# Patient Record
Sex: Male | Born: 1975 | Hispanic: No | Marital: Single | Smoking: Never smoker
Health system: Southern US, Community
[De-identification: ages and names within clinical notes are randomized; demographics above are authoritative.]

## PROBLEM LIST (undated history)

## (undated) DIAGNOSIS — R51 Headache: Secondary | ICD-10-CM

## (undated) DIAGNOSIS — R519 Headache, unspecified: Secondary | ICD-10-CM

## (undated) HISTORY — PX: OTHER SURGICAL HISTORY: SHX169

---

## 1997-12-05 ENCOUNTER — Emergency Department (HOSPITAL_COMMUNITY): Admission: EM | Admit: 1997-12-05 | Discharge: 1997-12-05 | Payer: Self-pay | Admitting: Emergency Medicine

## 1999-02-19 ENCOUNTER — Emergency Department (HOSPITAL_COMMUNITY): Admission: EM | Admit: 1999-02-19 | Discharge: 1999-02-20 | Payer: Self-pay | Admitting: Emergency Medicine

## 2004-07-14 ENCOUNTER — Emergency Department (HOSPITAL_COMMUNITY): Admission: EM | Admit: 2004-07-14 | Discharge: 2004-07-14 | Payer: Self-pay | Admitting: Family Medicine

## 2011-08-29 ENCOUNTER — Other Ambulatory Visit (HOSPITAL_COMMUNITY): Payer: Self-pay | Admitting: Orthopedic Surgery

## 2011-08-29 ENCOUNTER — Ambulatory Visit (HOSPITAL_COMMUNITY)
Admission: RE | Admit: 2011-08-29 | Discharge: 2011-08-29 | Disposition: A | Discharge status: Home / Self Care | Payer: 59 | Source: Ambulatory Visit | Attending: Orthopedic Surgery | Admitting: Orthopedic Surgery

## 2011-08-29 DIAGNOSIS — Z01818 Encounter for other preprocedural examination: Secondary | ICD-10-CM | POA: Insufficient documentation

## 2011-08-29 DIAGNOSIS — IMO0002 Reserved for concepts with insufficient information to code with codable children: Secondary | ICD-10-CM

## 2012-03-06 ENCOUNTER — Other Ambulatory Visit (HOSPITAL_COMMUNITY)
Admission: RE | Admit: 2012-03-06 | Discharge: 2012-03-06 | Disposition: A | Discharge status: Home / Self Care | Payer: 59 | Source: Ambulatory Visit | Attending: Emergency Medicine | Admitting: Emergency Medicine

## 2012-03-06 ENCOUNTER — Emergency Department (INDEPENDENT_AMBULATORY_CARE_PROVIDER_SITE_OTHER)
Admission: EM | Admit: 2012-03-06 | Discharge: 2012-03-06 | Disposition: A | Discharge status: Home / Self Care | Payer: 59 | Source: Home / Self Care | Attending: Emergency Medicine | Admitting: Emergency Medicine

## 2012-03-06 ENCOUNTER — Encounter (HOSPITAL_COMMUNITY): Payer: Self-pay

## 2012-03-06 DIAGNOSIS — Z202 Contact with and (suspected) exposure to infections with a predominantly sexual mode of transmission: Secondary | ICD-10-CM

## 2012-03-06 DIAGNOSIS — Z2089 Contact with and (suspected) exposure to other communicable diseases: Secondary | ICD-10-CM

## 2012-03-06 DIAGNOSIS — Z113 Encounter for screening for infections with a predominantly sexual mode of transmission: Secondary | ICD-10-CM | POA: Insufficient documentation

## 2012-03-06 NOTE — ED Provider Notes (Signed)
History     CSN: [id number]  Arrival date & time 03/06/12  1509   First MD Initiated Contact with Patient 03/06/12 1532      No chief complaint on file.   (Consider location/radiation/quality/duration/timing/severity/associated sxs/prior treatment) HPI Comments: Patient presents to urgent care this afternoon wanting to be screened for STDs. He describes he has been diagnosed with genital herpes. It has been years since he have not been screened for any other STDs. Patient denies any symptoms such as urethral discharge, dysuria, or new lead develop genitalia rashes. Patient is thinking of having a child and wants to make sure "  I don't have any STD"   The history is provided by the patient.    No past medical history on file.  No past surgical history on file.  No family history on file.  History  Substance Use Topics  . Smoking status: Not on file  . Smokeless tobacco: Not on file  . Alcohol Use: Not on file      Review of Systems  Constitutional: Negative for chills, diaphoresis, activity change and appetite change.  Genitourinary: Negative for dysuria, urgency, decreased urine volume, discharge, penile swelling, scrotal swelling, genital sores, penile pain and testicular pain.  Skin: Negative for rash.    Allergies  Review of patient's allergies indicates not on file.  Home Medications  No current outpatient prescriptions on file.  There were no vitals taken for this visit.  Physical Exam  Nursing note and vitals reviewed. Constitutional: He appears well-developed and well-nourished.  Neurological: He is alert.    ED Course  Procedures (including critical care time)   Labs Reviewed  URINE CYTOLOGY ANCILLARY ONLY  HIV ANTIBODY (ROUTINE TESTING)  RPR   No results found.   No diagnosis found.    MDM  Patient requesting STD screening. ( Known to have a genital herpes), we have obtained a urine culture for Chlamydia, gonorrhea and gases along with  HIV and syphilis screening. Patient has been informed that he will be contacted only if abnormal test results.        Jimmie Molly, MD 03/06/12 [phone]

## 2012-03-06 NOTE — ED Notes (Signed)
Concern for poss exposure to STD; admits to risky behaviors, and wants to be tested

## 2012-03-06 NOTE — ED Notes (Signed)
Cal back number for test reports verified

## 2012-03-06 NOTE — ED Notes (Signed)
Discussed MY CHART

## 2012-10-13 IMAGING — CR DG ORBITS FOR FOREIGN BODY
2 series · 2 of 2 positions shown · non-contrast
Comparison: None

CLINICAL DATA: Pre MRI.  History of exposure to metal.

ORBITS FOR FOREIGN BODY - 2 VIEW

[w waters * (1 of 2)]
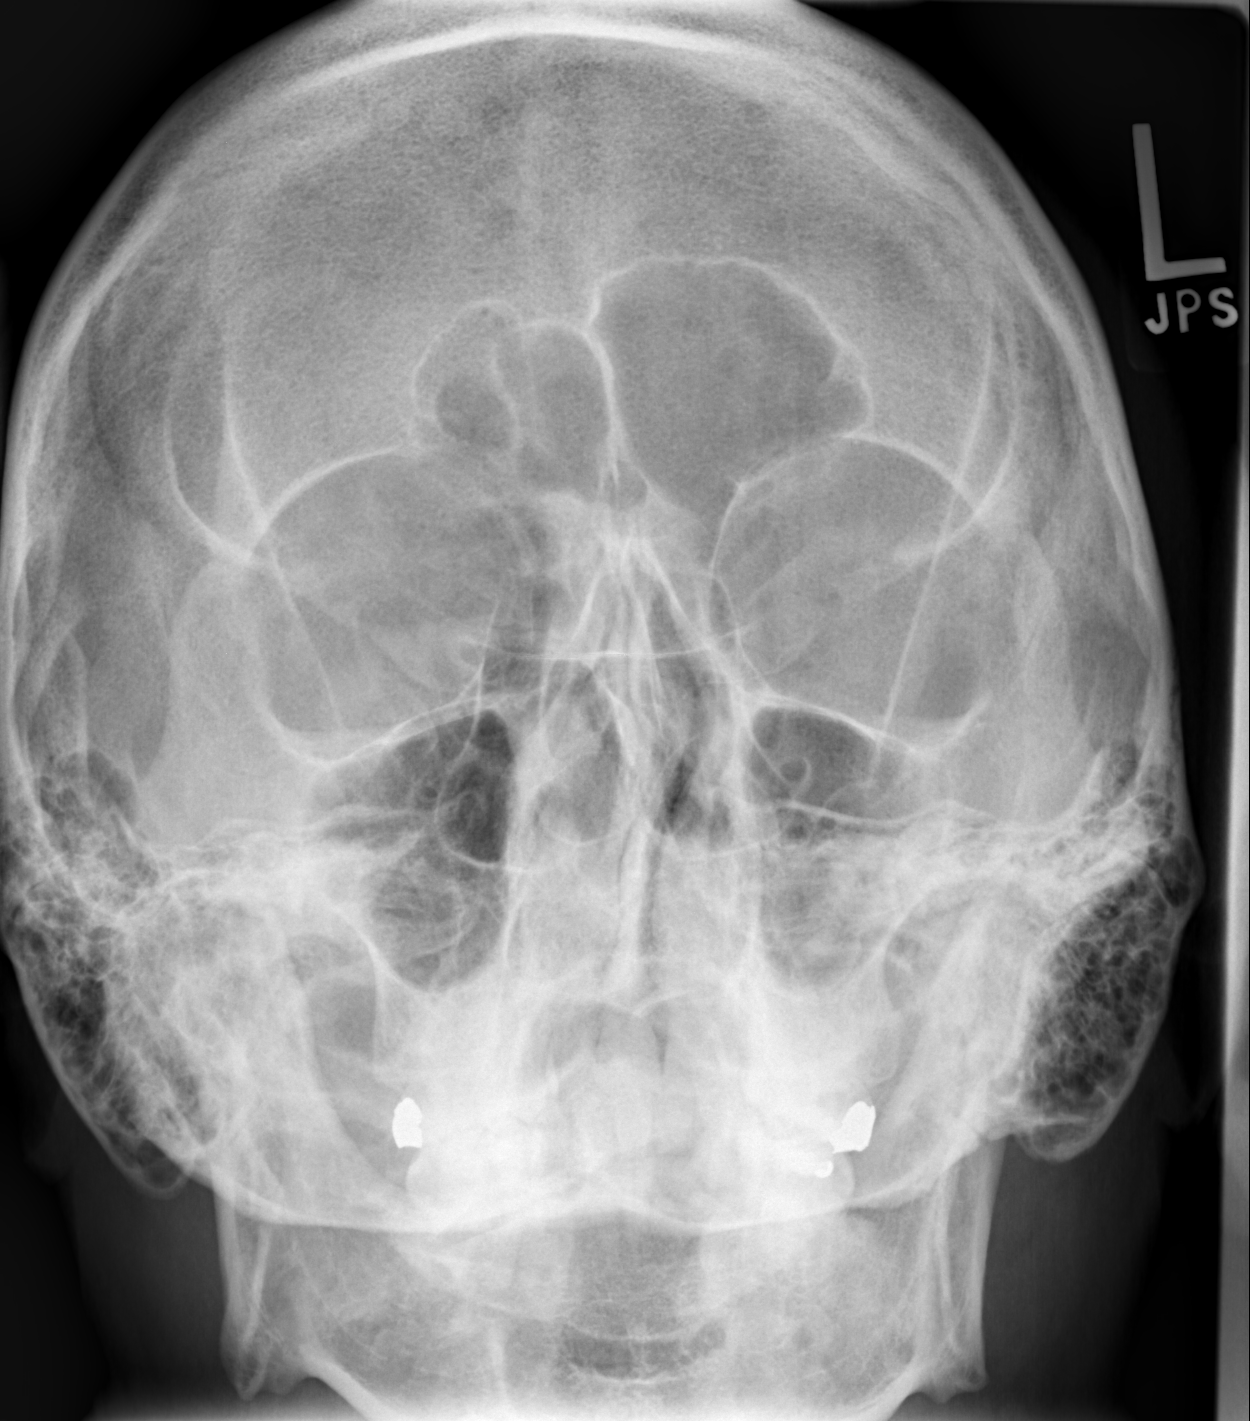

[w waters * (2 of 2)]
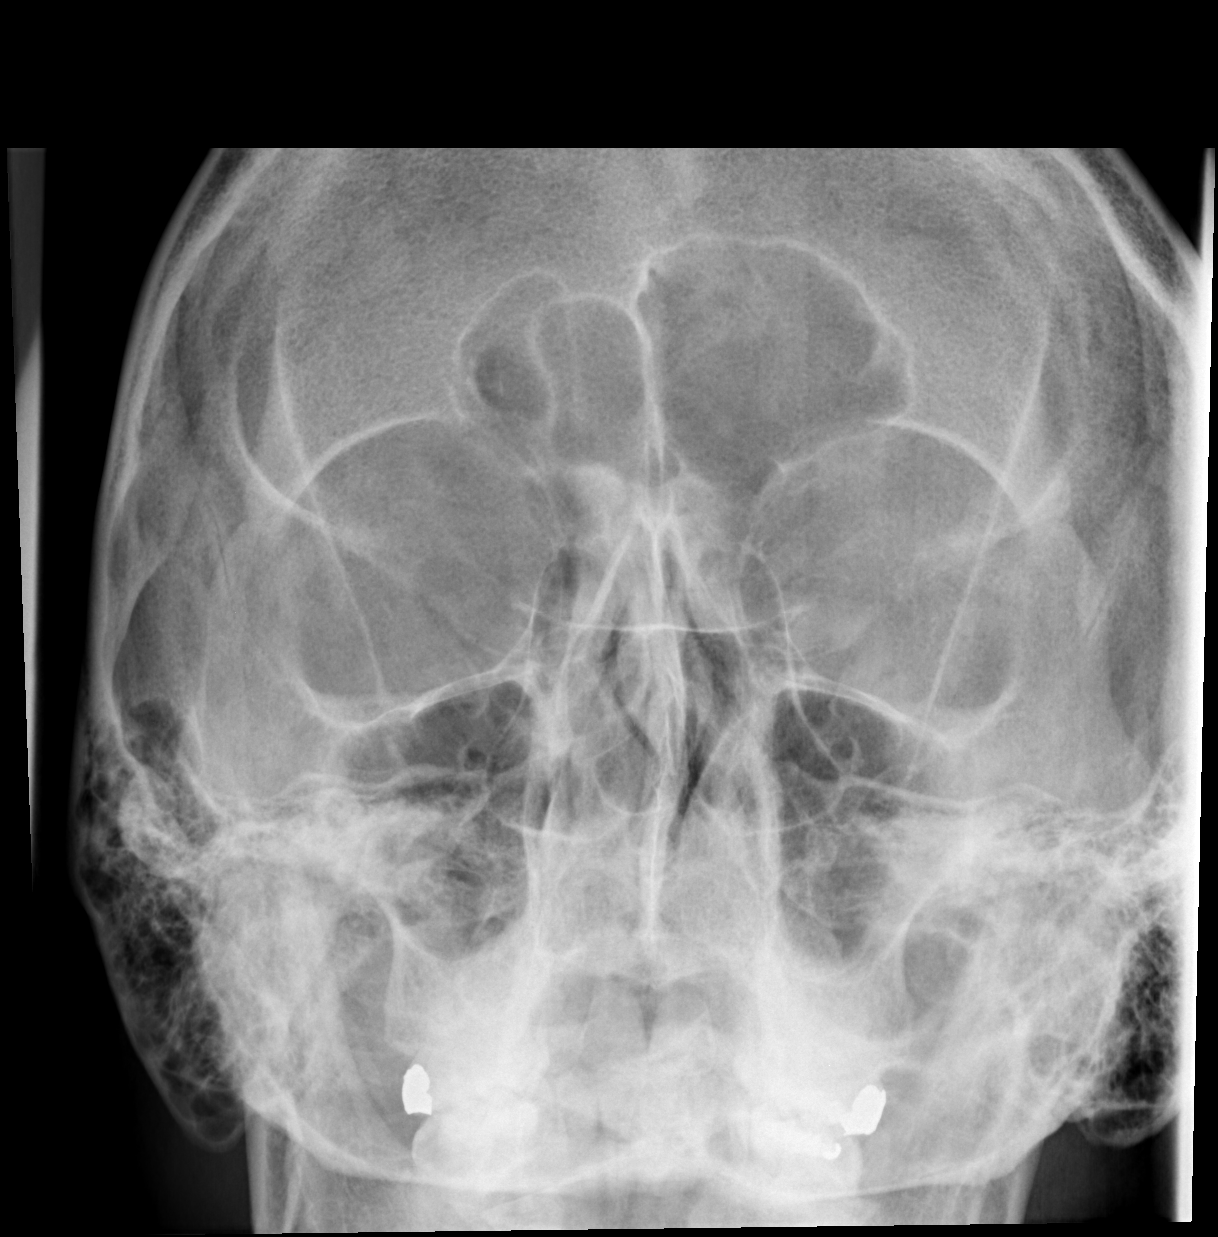

[2 of 2 positions shown; findings below may reference images not displayed]

FINDINGS: No evidence of radiopaque metallic foreign body
projecting over the orbits.  Visualized bony structures
unremarkable.
IMPRESSION: No evidence of radiopaque foreign body within the orbits.

## 2014-01-02 ENCOUNTER — Encounter (HOSPITAL_COMMUNITY): Payer: Self-pay

## 2014-01-02 ENCOUNTER — Emergency Department (HOSPITAL_COMMUNITY)
Admission: EM | Admit: 2014-01-02 | Discharge: 2014-01-03 | Disposition: A | Discharge status: Home / Self Care | Payer: 59 | Attending: Emergency Medicine | Admitting: Emergency Medicine

## 2014-01-02 DIAGNOSIS — Z880 Allergy status to penicillin: Secondary | ICD-10-CM | POA: Diagnosis not present

## 2014-01-02 DIAGNOSIS — G43909 Migraine, unspecified, not intractable, without status migrainosus: Secondary | ICD-10-CM | POA: Diagnosis not present

## 2014-01-02 DIAGNOSIS — G43001 Migraine without aura, not intractable, with status migrainosus: Secondary | ICD-10-CM

## 2014-01-02 DIAGNOSIS — R110 Nausea: Secondary | ICD-10-CM | POA: Diagnosis not present

## 2014-01-02 DIAGNOSIS — R51 Headache: Secondary | ICD-10-CM | POA: Diagnosis present

## 2014-01-02 HISTORY — DX: Headache: R51

## 2014-01-02 HISTORY — DX: Headache, unspecified: R51.9

## 2014-01-02 NOTE — ED Notes (Signed)
Pt presents with a all over headache, cold sweats, sensitivity to light and sounds, and nausea starting this am. Pt reports a hx of headaches. Pt states his pain is worse

## 2014-01-02 NOTE — ED Notes (Addendum)
Pt c/o headache x 12 hours, unrelieved with 600mg  ibuprofen. Reports hx of same. Also reports fever and chills at home which pt states is how migraines progress. Headache associated with NV and photophobia

## 2014-01-03 MED ORDER — IBUPROFEN 600 MG PO TABS: 600.0000 mg | ORAL_TABLET | Freq: Four times a day (QID) | ORAL | Status: DC | PRN

## 2014-01-03 MED ORDER — SODIUM CHLORIDE 0.9 % IV BOLUS (SEPSIS)
1000.0000 mL | Freq: Once | INTRAVENOUS | Status: AC
  Administered 2014-01-03: 1000 mL via INTRAVENOUS

## 2014-01-03 MED ORDER — KETOROLAC TROMETHAMINE 30 MG/ML IJ SOLN
30.0000 mg | Freq: Once | INTRAMUSCULAR | Status: AC
  Administered 2014-01-03: 30 mg via INTRAVENOUS
  Filled 2014-01-03: qty 1

## 2014-01-03 MED ORDER — TRAMADOL HCL 50 MG PO TABS: 50.0000 mg | ORAL_TABLET | Freq: Four times a day (QID) | ORAL | Status: DC | PRN

## 2014-01-03 MED ORDER — METOCLOPRAMIDE HCL 5 MG/ML IJ SOLN
10.0000 mg | Freq: Once | INTRAMUSCULAR | Status: AC
  Administered 2014-01-03: 10 mg via INTRAVENOUS
  Filled 2014-01-03: qty 2

## 2014-01-03 NOTE — ED Notes (Signed)
Dr. Nanvati at bedside  

## 2014-01-03 NOTE — ED Provider Notes (Signed)
CSN: [id number]     Arrival date & time 01/02/14  2239 History   First MD Initiated Contact with Patient 01/02/14 2354     Chief Complaint  Patient presents with  . Headache     (Consider location/radiation/quality/duration/timing/severity/associated sxs/prior Treatment) HPI Comments: Shawn Pruitt is a 38 y.o. male who complains of migraine headache for 1 day(s). He has a well established history of recurrent migraines.  Description of pain: throbbing pain, dull pain, unilateral in the right frontal area. Associated symptoms: Nausea, light sensitivity and noise sensitivity. Patient has already taken ibuprofen 600 mg for this headache with partial relief. No vomiting, visual complains, seizures, altered mental status, loss of consciousness, new weakness, or numbness, no gait instability. No family hx of brain AN, cancers. Pt gets flare ups occasionally, currently he thinks poor sleeping might have triggered his current episode.    Patient is a 38 y.o. male presenting with headaches. The history is provided by the patient.  Headache Associated symptoms: nausea   Associated symptoms: no abdominal pain, no cough and no seizures     Past Medical History  Diagnosis Date  . Headache    Past Surgical History  Procedure Laterality Date  . Knee      History reviewed. No pertinent family history. History  Substance Use Topics  . Smoking status: Never Smoker   . Smokeless tobacco: Not on file  . Alcohol Use: No    Review of Systems  Constitutional: Negative for activity change and appetite change.  Eyes: Negative for visual disturbance.  Respiratory: Negative for cough and shortness of breath.   Cardiovascular: Negative for chest pain.  Gastrointestinal: Positive for nausea. Negative for abdominal pain.  Genitourinary: Negative for dysuria.  Neurological: Positive for headaches. Negative for seizures.      Allergies  Benadryl and Penicillins  Home Medications    Prior to Admission medications   Medication Sig Start Date End Date Taking? Authorizing Provider  ibuprofen (ADVIL,MOTRIN) 600 MG tablet Take 1 tablet (600 mg total) by mouth every 6 (six) hours as needed. 01/03/14   Derwood KaplanAnkit Anguel Delapena, MD  traMADol (ULTRAM) 50 MG tablet Take 1 tablet (50 mg total) by mouth every 6 (six) hours as needed. 01/03/14   Mizraim Harmening, MD   BP 115/70 mmHg  Pulse 67  Temp(Src) 97.9 F (36.6 C) (Oral)  Resp 18  SpO2 97% Physical Exam  Constitutional: He is oriented to person, place, and time. He appears well-developed.  HENT:  Head: Normocephalic and atraumatic.  Eyes: Conjunctivae and EOM are normal. Pupils are equal, round, and reactive to light.  Neck: Normal range of motion. Neck supple.  Cardiovascular: Normal rate and regular rhythm.   Pulmonary/Chest: Effort normal and breath sounds normal.  Abdominal: Soft. Bowel sounds are normal. He exhibits no distension. There is no tenderness. There is no rebound and no guarding.  Neurological: He is alert and oriented to person, place, and time. No cranial nerve deficit. Coordination normal.  Skin: Skin is warm.    ED Course  Procedures (including critical care time) Labs Review Labs Reviewed - No data to display  Imaging Review No results found.   EKG Interpretation None      2:35 AM Headache drastically improved. Will discharge. MDM   Final diagnoses:  Migraine without aura and with status migrainosus, not intractable    DDX includes: Primary headaches - including migrainous headaches, cluster headaches, tension headaches. ICH Carotid dissection Cavernous sinus thrombosis Meningitis Encephalitis Sinusitis Tumor Vascular headaches AV  malformation Brain aneurysm Muscular headaches  A/P: Pt comes in with cc of headaches. No concerns for life threatening secondary headaches because pt has hx of migraines, and the current headaches are similar to his previous attacks. PT also able to  recognize a potential trigerring event, poor sleep. Pt has normal neuro exam. Headache cocktail ordered - and i just reassessed him, and he is feeling a lot better. Stable for d/c.  Derwood KaplanAnkit Dajah Fischman, MD 01/03/14 [id number]

## 2014-01-03 NOTE — Discharge Instructions (Signed)
We saw you in the ER for headaches. All the labs and imaging are normal. We are not sure what is causing your headaches, however, there appears to be no evidence of infection, bleeds or tumors based on our exam and results.  Please take motrin round the clock for the next 6 hours, and take other meds prescribed only for break through pain. See your doctor if the pain persists, as you might need better medications or a specialist.   Migraine Headache A migraine headache is an intense, throbbing pain on one or both sides of your head. A migraine can last for 30 minutes to several hours. CAUSES  The exact cause of a migraine headache is not always known. However, a migraine may be caused when nerves in the brain become irritated and release chemicals that cause inflammation. This causes pain. Certain things may also trigger migraines, such as:  Alcohol.  Smoking.  Stress.  Menstruation.  Aged cheeses.  Foods or drinks that contain nitrates, glutamate, aspartame, or tyramine.  Lack of sleep.  Chocolate.  Caffeine.  Hunger.  Physical exertion.  Fatigue.  Medicines used to treat chest pain (nitroglycerine), birth control pills, estrogen, and some blood pressure medicines. SIGNS AND SYMPTOMS  Pain on one or both sides of your head.  Pulsating or throbbing pain.  Severe pain that prevents daily activities.  Pain that is aggravated by any physical activity.  Nausea, vomiting, or both.  Dizziness.  Pain with exposure to bright lights, loud noises, or activity.  General sensitivity to bright lights, loud noises, or smells. Before you get a migraine, you may get warning signs that a migraine is coming (aura). An aura may include:  Seeing flashing lights.  Seeing bright spots, halos, or zigzag lines.  Having tunnel vision or blurred vision.  Having feelings of numbness or tingling.  Having trouble talking.  Having muscle weakness. DIAGNOSIS  A migraine headache  is often diagnosed based on:  Symptoms.  Physical exam.  A CT scan or MRI of your head. These imaging tests cannot diagnose migraines, but they can help rule out other causes of headaches. TREATMENT Medicines may be given for pain and nausea. Medicines can also be given to help prevent recurrent migraines.  HOME CARE INSTRUCTIONS  Only take over-the-counter or prescription medicines for pain or discomfort as directed by your health care provider. The use of long-term narcotics is not recommended.  Lie down in a dark, quiet room when you have a migraine.  Keep a journal to find out what may trigger your migraine headaches. For example, write down:  What you eat and drink.  How much sleep you get.  Any change to your diet or medicines.  Limit alcohol consumption.  Quit smoking if you smoke.  Get 7-9 hours of sleep, or as recommended by your health care provider.  Limit stress.  Keep lights dim if bright lights bother you and make your migraines worse. SEEK IMMEDIATE MEDICAL CARE IF:   Your migraine becomes severe.  You have a fever.  You have a stiff neck.  You have vision loss.  You have muscular weakness or loss of muscle control.  You start losing your balance or have trouble walking.  You feel faint or pass out.  You have severe symptoms that are different from your first symptoms. MAKE SURE YOU:   Understand these instructions.  Will watch your condition.  Will get help right away if you are not doing well or get worse. Document Released:  02/18/2005 Document Revised: 07/05/2013 Document Reviewed: 10/26/2012 [facility] Patient Information 2015 [place], [place]. This information is not intended to replace advice given to you by your health care provider. Make sure you discuss any questions you have with your health care provider.  Recurrent Migraine Headache A migraine headache is an intense, throbbing pain on one or both sides of your head. Recurrent  migraines keep coming back. A migraine can last for 30 minutes to several hours. CAUSES  The exact cause of a migraine headache is not always known. However, a migraine may be caused when nerves in the brain become irritated and release chemicals that cause inflammation. This causes pain. Certain things may also trigger migraines, such as:   Alcohol.  Smoking.  Stress.  Menstruation.  Aged cheeses.  Foods or drinks that contain nitrates, glutamate, aspartame, or tyramine.  Lack of sleep.  Chocolate.  Caffeine.  Hunger.  Physical exertion.  Fatigue.  Medicines used to treat chest pain (nitroglycerine), birth control pills, estrogen, and some blood pressure medicines. SYMPTOMS   Pain on one or both sides of your head.  Pulsating or throbbing pain.  Severe pain that prevents daily activities.  Pain that is aggravated by any physical activity.  Nausea, vomiting, or both.  Dizziness.  Pain with exposure to bright lights, loud noises, or activity.  General sensitivity to bright lights, loud noises, or smells. Before you get a migraine, you may get warning signs that a migraine is coming (aura). An aura may include:  Seeing flashing lights.  Seeing bright spots, halos, or zigzag lines.  Having tunnel vision or blurred vision.  Having feelings of numbness or tingling.  Having trouble talking.  Having muscle weakness. DIAGNOSIS  A recurrent migraine headache is often diagnosed based on:  Symptoms.  Physical examination.  A CT scan or MRI of your head. These imaging tests cannot diagnose migraines but can help rule out other causes of headaches.  TREATMENT  Medicines may be given for pain and nausea. Medicines can also be given to help prevent recurrent migraines. HOME CARE INSTRUCTIONS  Only take over-the-counter or prescription medicines for pain or discomfort as directed by your health care provider. The use of long-term narcotics is not  recommended.  Lie down in a dark, quiet room when you have a migraine.  Keep a journal to find out what may trigger your migraine headaches. For example, write down:  What you eat and drink.  How much sleep you get.  Any change to your diet or medicines.  Limit alcohol consumption.  Quit smoking if you smoke.  Get 7-9 hours of sleep, or as recommended by your health care provider.  Limit stress.  Keep lights dim if bright lights bother you and make your migraines worse. SEEK MEDICAL CARE IF:   You do not get relief from the medicines given to you.  You have a recurrence of pain.  You have a fever. SEEK IMMEDIATE MEDICAL CARE IF:  Your migraine becomes severe.  You have a stiff neck.  You have loss of vision.  You have muscular weakness or loss of muscle control.  You start losing your balance or have trouble walking.  You feel faint or pass out.  You have severe symptoms that are different from your first symptoms. MAKE SURE YOU:   Understand these instructions.  Will watch your condition.  Will get help right away if you are not doing well or get worse. Document Released: 11/13/2000 Document Revised: 07/05/2013 Document Reviewed: 10/26/2012  ExitCare® Patient Information ©2015 [employer]. This information is not intended to replace advice given to you by your health care provider. Make sure you discuss any questions you have with your health care provider. ° °

## 2014-12-23 ENCOUNTER — Emergency Department (HOSPITAL_BASED_OUTPATIENT_CLINIC_OR_DEPARTMENT_OTHER)
Admission: EM | Admit: 2014-12-23 | Discharge: 2014-12-23 | Disposition: A | Discharge status: Home / Self Care | Payer: 59 | Attending: Emergency Medicine | Admitting: Emergency Medicine

## 2014-12-23 ENCOUNTER — Encounter (HOSPITAL_BASED_OUTPATIENT_CLINIC_OR_DEPARTMENT_OTHER): Payer: Self-pay | Admitting: *Deleted

## 2014-12-23 DIAGNOSIS — J039 Acute tonsillitis, unspecified: Secondary | ICD-10-CM

## 2014-12-23 DIAGNOSIS — R509 Fever, unspecified: Secondary | ICD-10-CM | POA: Diagnosis present

## 2014-12-23 DIAGNOSIS — Z880 Allergy status to penicillin: Secondary | ICD-10-CM | POA: Diagnosis not present

## 2014-12-23 LAB — CBC WITH DIFFERENTIAL/PLATELET
BASOS PCT: 0 %
Basophils Absolute: 0 10*3/uL (ref 0.0–0.1)
Eosinophils Absolute: 0 10*3/uL (ref 0.0–0.7)
Eosinophils Relative: 0 %
HCT: 43.1 % (ref 39.0–52.0)
HEMOGLOBIN: 14 g/dL (ref 13.0–17.0)
Lymphocytes Relative: 9 %
Lymphs Abs: 1.4 10*3/uL (ref 0.7–4.0)
MCH: 25.4 pg — ABNORMAL LOW (ref 26.0–34.0)
MCHC: 32.5 g/dL (ref 30.0–36.0)
MCV: 78.1 fL (ref 78.0–100.0)
Monocytes Absolute: 1.2 10*3/uL — ABNORMAL HIGH (ref 0.1–1.0)
Monocytes Relative: 7 %
NEUTROS PCT: 84 %
Neutro Abs: 13.2 10*3/uL — ABNORMAL HIGH (ref 1.7–7.7)
Platelets: 248 10*3/uL (ref 150–400)
RBC: 5.52 MIL/uL (ref 4.22–5.81)
RDW: 13.6 % (ref 11.5–15.5)
WBC: 15.8 10*3/uL — ABNORMAL HIGH (ref 4.0–10.5)

## 2014-12-23 LAB — BASIC METABOLIC PANEL
Anion gap: 4 — ABNORMAL LOW (ref 5–15)
BUN: 11 mg/dL (ref 6–20)
CHLORIDE: 104 mmol/L (ref 101–111)
CO2: 26 mmol/L (ref 22–32)
CREATININE: 0.97 mg/dL (ref 0.61–1.24)
Calcium: 8.5 mg/dL — ABNORMAL LOW (ref 8.9–10.3)
Glucose, Bld: 131 mg/dL — ABNORMAL HIGH (ref 65–99)
Potassium: 3.1 mmol/L — ABNORMAL LOW (ref 3.5–5.1)
SODIUM: 134 mmol/L — AB (ref 135–145)

## 2014-12-23 LAB — RAPID STREP SCREEN (MED CTR MEBANE ONLY): Streptococcus, Group A Screen (Direct): NEGATIVE

## 2014-12-23 LAB — MONONUCLEOSIS SCREEN: Mono Screen: NEGATIVE

## 2014-12-23 MED ORDER — SODIUM CHLORIDE 0.9 % IV BOLUS (SEPSIS)
1000.0000 mL | Freq: Once | INTRAVENOUS | Status: AC
  Administered 2014-12-23: 1000 mL via INTRAVENOUS

## 2014-12-23 MED ORDER — CEFTRIAXONE SODIUM 1 G IJ SOLR
INTRAMUSCULAR | Status: AC
  Filled 2014-12-23: qty 10

## 2014-12-23 MED ORDER — IBUPROFEN 400 MG PO TABS
600.0000 mg | ORAL_TABLET | Freq: Once | ORAL | Status: AC
  Administered 2014-12-23: 600 mg via ORAL
  Filled 2014-12-23 (×2): qty 1

## 2014-12-23 MED ORDER — ACETAMINOPHEN 500 MG PO TABS
1000.0000 mg | ORAL_TABLET | Freq: Once | ORAL | Status: AC
  Administered 2014-12-23: 1000 mg via ORAL
  Filled 2014-12-23: qty 2

## 2014-12-23 MED ORDER — DEXTROSE 5 % IV SOLN
1.0000 g | Freq: Once | INTRAVENOUS | Status: AC
  Administered 2014-12-23: 1 g via INTRAVENOUS

## 2014-12-23 NOTE — Discharge Instructions (Signed)
Tonsillitis °Tonsillitis is an infection of the throat. This infection causes the tonsils to become red, tender, and puffy (swollen). Tonsils are groups of tissue at the back of your throat. If bacteria caused your infection, antibiotic medicine will be given to you. Sometimes symptoms of tonsillitis can be relieved with the use of steroid medicine. If your tonsillitis is severe and happens often, you may need to get your tonsils removed (tonsillectomy). °HOME CARE  °· Rest and sleep often. °· Drink enough fluids to keep your pee (urine) clear or pale yellow. °· While your throat is sore, eat soft or liquid foods like: °¨ Soup. °¨ Ice cream. °¨ Instant breakfast drinks. °· Eat frozen ice pops. °· Gargle with a warm or cold liquid to help soothe the throat. Gargle with a water and salt mix. Mix 1/4 teaspoon of salt and 1/4 teaspoon of baking soda in 1 cup of water. °· Only take medicines as told by your doctor. °· If you are given medicines (antibiotics), take them as told. Finish them even if you start to feel better. °GET HELP IF: °· You have large, tender lumps in your neck. °· You have a rash. °· You cough up green, yellow-brown, or bloody fluid. °· You cannot swallow liquids or food for 24 hours. °· You notice that only one of your tonsils is swollen. °GET HELP RIGHT AWAY IF:  °· You throw up (vomit). °· You have a very bad headache. °· You have a stiff neck. °· You have chest pain. °· You have trouble breathing or swallowing. °· You have bad throat pain, drooling, or your voice changes. °· You have bad pain not helped by medicine. °· You cannot fully open your mouth. °· You have redness, puffiness, or bad pain in the neck. °· You have a fever. °MAKE SURE YOU:  °· Understand these instructions. °· Will watch your condition. °· Will get help right away if you are not doing well or get worse. °  °This information is not intended to replace advice given to you by your health care provider. Make sure you discuss any  questions you have with your health care provider. °  °Document Released: 08/07/2007 Document Revised: 02/23/2013 Document Reviewed: 08/07/2012 °Elsevier Interactive Patient Education ©2016 [employer]vier Inc. ° °

## 2014-12-23 NOTE — ED Notes (Signed)
Patient sleeping at this time.

## 2014-12-23 NOTE — ED Provider Notes (Signed)
CSN: [id number]     Arrival date & time 12/23/14  1206 History   First MD Initiated Contact with Patient 12/23/14 1349     Chief Complaint  Patient presents with  . Influenza     Patient is a 39 y.o. male presenting with flu symptoms.  Influenza  Aching all over, headache, sore throat, and fever since yesterday. Feels like he has the flu per pt. Past Medical History  Diagnosis Date  . Headache    Past Surgical History  Procedure Laterality Date  . Knee      No family history on file. Social History  Substance Use Topics  . Smoking status: Never Smoker   . Smokeless tobacco: None  . Alcohol Use: No    Review of Systems  All other systems reviewed and are negative.     Allergies  Benadryl and Penicillins  Home Medications   Prior to Admission medications   Medication Sig Start Date End Date Taking? Authorizing Provider  ibuprofen (ADVIL,MOTRIN) 600 MG tablet Take 1 tablet (600 mg total) by mouth every 6 (six) hours as needed. 01/03/14   Derwood Kaplan, MD  traMADol (ULTRAM) 50 MG tablet Take 1 tablet (50 mg total) by mouth every 6 (six) hours as needed. 01/03/14   Ankit Nanavati, MD   BP 120/77 mmHg  Pulse 115  Temp(Src) 102.4 F (39.1 C) (Oral)  Resp 20  Ht  (1.727 m)  Wt 250 lb (113.399 kg)  BMI 38.02 kg/m2  SpO2 99% Physical Exam  Constitutional: He is oriented to person, place, and time. He appears well-developed and well-nourished. No distress.  HENT:  Head: Normocephalic and atraumatic.  Mouth/Throat: Mucous membranes are dry. Oropharyngeal exudate and posterior oropharyngeal erythema present. No tonsillar abscesses.  Eyes: Pupils are equal, round, and reactive to light.  Neck: Normal range of motion.  Cardiovascular: Normal rate and intact distal pulses.   Pulmonary/Chest: No respiratory distress.  Abdominal: Normal appearance. He exhibits no distension.  Musculoskeletal: Normal range of motion.  Lymphadenopathy:    He has cervical adenopathy.   Neurological: He is alert and oriented to person, place, and time. No cranial nerve deficit.  Skin: Skin is warm and dry. No rash noted.  Psychiatric: He has a normal mood and affect. His behavior is normal.  Nursing note and vitals reviewed.   ED Course  Procedures (including critical care time) Medications  sodium chloride 0.9 % bolus 1,000 mL (1,000 mLs Intravenous New Bag/Given 12/23/14 1417)  cefTRIAXone (ROCEPHIN) 1 g in dextrose 5 % 50 mL IVPB (not administered)  acetaminophen (TYLENOL) tablet 1,000 mg (1,000 mg Oral Given 12/23/14 1216)  ibuprofen (ADVIL,MOTRIN) tablet 600 mg (600 mg Oral Given 12/23/14 1256)    Labs Review Labs Reviewed  CBC WITH DIFFERENTIAL/PLATELET - Abnormal; Notable for the following:    WBC 15.8 (*)    MCH 25.4 (*)    Neutro Abs 13.2 (*)    Monocytes Absolute 1.2 (*)    All other components within normal limits  BASIC METABOLIC PANEL - Abnormal; Notable for the following:    Sodium 134 (*)    Potassium 3.1 (*)    Glucose, Bld 131 (*)    Calcium 8.5 (*)    Anion gap 4 (*)    All other components within normal limits  RAPID STREP SCREEN (NOT AT [facility])  CULTURE, GROUP A STREP  MONONUCLEOSIS SCREEN       MDM   Final diagnoses:  Tonsillitis  Nelva Nayobert Consuelo Suthers, MD 12/23/14 [phone]

## 2014-12-23 NOTE — ED Notes (Signed)
Aching all over, headache, sore throat, and fever since yesterday. Feels like he has the flu per pt.

## 2014-12-25 LAB — CULTURE, GROUP A STREP: STREP A CULTURE: NEGATIVE

## 2016-05-31 ENCOUNTER — Ambulatory Visit (HOSPITAL_COMMUNITY)
Admission: EM | Admit: 2016-05-31 | Discharge: 2016-05-31 | Disposition: A | Discharge status: Home / Self Care | Payer: 59 | Attending: Internal Medicine | Admitting: Internal Medicine

## 2016-05-31 ENCOUNTER — Encounter (HOSPITAL_COMMUNITY): Payer: Self-pay | Admitting: Family Medicine

## 2016-05-31 DIAGNOSIS — R2232 Localized swelling, mass and lump, left upper limb: Secondary | ICD-10-CM

## 2016-05-31 DIAGNOSIS — T63441A Toxic effect of venom of bees, accidental (unintentional), initial encounter: Secondary | ICD-10-CM | POA: Diagnosis not present

## 2016-05-31 NOTE — Discharge Instructions (Signed)
Apply cold compresses to 3 times a day to the hand and finger. Try to elevate the hand to decrease swelling. For swelling, itching or development of redness may apply Benadryl and/or 1% hydrocortisone cream over-the-counter.

## 2016-05-31 NOTE — ED Provider Notes (Signed)
CSN: [id number]     Arrival date & time 05/31/16  1510 History   First MD Initiated Contact with Patient 05/31/16 1652     Chief Complaint  Patient presents with  . Insect Bite   (Consider location/radiation/quality/duration/timing/severity/associated sxs/prior Treatment) 41-year-old male states he was stung by a bee to the left ring finger yesterday. There was some initial swelling went down later in the day. Overnight he noticed more swelling to the left ring finger that extended into the hand. Today he has mild swelling to the hand and left ring finger. Minor erythema to the ring finger. No systemic symptoms.      Past Medical History:  Diagnosis Date  . Headache    Past Surgical History:  Procedure Laterality Date  . knee      History reviewed. No pertinent family history. Social History  Substance Use Topics  . Smoking status: Never Smoker  . Smokeless tobacco: Never Used  . Alcohol use No    Review of Systems  Constitutional: Negative.   HENT: Negative.   Respiratory: Negative.   Skin: Negative for rash.  Neurological: Negative.   All other systems reviewed and are negative.   Allergies  Benadryl [diphenhydramine hcl] and Penicillins  Home Medications   Prior to Admission medications   Medication Sig Start Date End Date Taking? Authorizing Provider  ibuprofen (ADVIL,MOTRIN) 600 MG tablet Take 1 tablet (600 mg total) by mouth every 6 (six) hours as needed. 01/03/14   Derwood Kaplan, MD  traMADol (ULTRAM) 50 MG tablet Take 1 tablet (50 mg total) by mouth every 6 (six) hours as needed. 01/03/14   Derwood Kaplan, MD   Meds Ordered and Administered this Visit  Medications - No data to display  BP 105/63 (BP Location: Right Arm)   Pulse 67   Temp 98.4 F (36.9 C) (Oral)   Resp 16   SpO2 98%  No data found.   Physical Exam  Constitutional: He is oriented to person, place, and time. He appears well-developed and well-nourished. No distress.  HENT:  No  intraoral swelling.  Eyes: EOM are normal.  Cardiovascular: Normal rate.   Pulmonary/Chest: Effort normal. No respiratory distress. He has no wheezes.  Musculoskeletal:  Mild swelling to the left hand and left ring finger. No rash. Minor tenderness to the left ring finger. Still neurovascular motor Sentry is intact. Flexion and extension is intact.  Neurological: He is alert and oriented to person, place, and time.  Skin: Skin is warm and dry. No rash noted. No erythema.  Psychiatric: He has a normal mood and affect.  Nursing note and vitals reviewed.   Urgent Care Course     Procedures (including critical care time)  Labs Review Labs Reviewed - No data to display  Imaging Review No results found.   Visual Acuity Review  Right Eye Distance:   Left Eye Distance:   Bilateral Distance:    Right Eye Near:   Left Eye Near:    Bilateral Near:         MDM   1. Bee sting, accidental or unintentional, initial encounter    Apply cold compresses to 3 times a day to the hand and finger. Try to elevate the hand to decrease swelling. For swelling, itching or development of redness may apply Benadryl and/or 1% hydrocortisone cream over-the-counter.     Hayden Rasmussen, NP 05/31/16 [phone]

## 2016-05-31 NOTE — ED Triage Notes (Signed)
Pt here for swelling to left hand after being stung by a wasp yesterday.

## 2019-01-07 ENCOUNTER — Other Ambulatory Visit: Payer: Self-pay

## 2019-01-07 DIAGNOSIS — Z20822 Contact with and (suspected) exposure to covid-19: Secondary | ICD-10-CM

## 2019-01-09 LAB — NOVEL CORONAVIRUS, NAA: SARS-CoV-2, NAA: NOT DETECTED

## 2019-08-09 ENCOUNTER — Telehealth: Payer: Self-pay | Admitting: Internal Medicine

## 2019-08-09 NOTE — Telephone Encounter (Signed)
    Have you agreed to see Shawn Pruitt? His mother Vick Filter  (MRN: [id number])FB a current patient Please advise

## 2019-08-12 ENCOUNTER — Ambulatory Visit (INDEPENDENT_AMBULATORY_CARE_PROVIDER_SITE_OTHER): Payer: 59 | Admitting: Internal Medicine

## 2019-08-12 ENCOUNTER — Encounter: Payer: Self-pay | Admitting: Internal Medicine

## 2019-08-12 ENCOUNTER — Other Ambulatory Visit: Payer: Self-pay

## 2019-08-12 VITALS — BP 118/80 | HR 77 | Temp 98.2°F | Resp 16 | Ht 68.0 in | Wt 274.5 lb

## 2019-08-12 DIAGNOSIS — R739 Hyperglycemia, unspecified: Secondary | ICD-10-CM | POA: Diagnosis not present

## 2019-08-12 DIAGNOSIS — Z6841 Body Mass Index (BMI) 40.0 and over, adult: Secondary | ICD-10-CM

## 2019-08-12 DIAGNOSIS — Z23 Encounter for immunization: Secondary | ICD-10-CM | POA: Diagnosis not present

## 2019-08-12 DIAGNOSIS — Z0000 Encounter for general adult medical examination without abnormal findings: Secondary | ICD-10-CM | POA: Insufficient documentation

## 2019-08-12 LAB — CBC WITH DIFFERENTIAL/PLATELET
Basophils Absolute: 0.1 10*3/uL (ref 0.0–0.1)
Basophils Relative: 0.7 % (ref 0.0–3.0)
Eosinophils Absolute: 0.2 10*3/uL (ref 0.0–0.7)
Eosinophils Relative: 2.7 % (ref 0.0–5.0)
HCT: 43.4 % (ref 39.0–52.0)
Hemoglobin: 14.3 g/dL (ref 13.0–17.0)
Lymphocytes Relative: 34 % (ref 12.0–46.0)
Lymphs Abs: 3.1 10*3/uL (ref 0.7–4.0)
MCHC: 33 g/dL (ref 30.0–36.0)
MCV: 79 fl (ref 78.0–100.0)
Monocytes Absolute: 0.9 10*3/uL (ref 0.1–1.0)
Monocytes Relative: 9.8 % (ref 3.0–12.0)
Neutro Abs: 4.8 10*3/uL (ref 1.4–7.7)
Neutrophils Relative %: 52.8 % (ref 43.0–77.0)
Platelets: 315 10*3/uL (ref 150.0–400.0)
RBC: 5.5 Mil/uL (ref 4.22–5.81)
RDW: 14 % (ref 11.5–15.5)
WBC: 9.1 10*3/uL (ref 4.0–10.5)

## 2019-08-12 LAB — BASIC METABOLIC PANEL
BUN: 14 mg/dL (ref 6–23)
CO2: 28 mEq/L (ref 19–32)
Calcium: 9.2 mg/dL (ref 8.4–10.5)
Chloride: 100 mEq/L (ref 96–112)
Creatinine, Ser: 0.97 mg/dL (ref 0.40–1.50)
GFR: 84.37 mL/min (ref >60.00)
Glucose, Bld: 98 mg/dL (ref 70–99)
Potassium: 4.1 mEq/L (ref 3.5–5.1)
Sodium: 137 mEq/L (ref 135–145)

## 2019-08-12 LAB — LIPID PANEL
Cholesterol: 217 mg/dL — ABNORMAL HIGH (ref 0–200)
HDL: 30.4 mg/dL — ABNORMAL LOW (ref >39.00)
NonHDL: 186.69
Total CHOL/HDL Ratio: 7
Triglycerides: 335 mg/dL — ABNORMAL HIGH (ref 0.0–149.0)
VLDL: 67 mg/dL — ABNORMAL HIGH (ref 0.0–40.0)

## 2019-08-12 LAB — PSA: PSA: 1.27 ng/mL (ref 0.10–4.00)

## 2019-08-12 LAB — HEPATIC FUNCTION PANEL
ALT: 23 U/L (ref 0–53)
AST: 18 U/L (ref 0–37)
Albumin: 4.2 g/dL (ref 3.5–5.2)
Alkaline Phosphatase: 104 U/L (ref 39–117)
Bilirubin, Direct: 0.1 mg/dL (ref 0.0–0.3)
Total Bilirubin: 0.7 mg/dL (ref 0.2–1.2)
Total Protein: 7.6 g/dL (ref 6.0–8.3)

## 2019-08-12 LAB — LDL CHOLESTEROL, DIRECT: Direct LDL: 131 mg/dL

## 2019-08-12 LAB — TSH: TSH: 0.87 u[IU]/mL (ref 0.35–4.50)

## 2019-08-12 NOTE — Progress Notes (Signed)
Subjective:  Patient ID: Shawn Pruitt, male    DOB: 28-Nov-1975  Age: 44 y.o. MRN: [id number]  CC: Annual Exam  This visit occurred during the SARS-CoV-2 public health emergency.  Safety protocols were in place, including screening questions prior to the visit, additional usage of staff PPE, and extensive cleaning of exam room while observing appropriate contact time as indicated for disinfecting solutions.   NEW TO ME  HPI Shawn Pruitt presents for a CPX.   He has not recently done much cardiovascular training because he suffers from chronic right knee pain that has been treated by an orthopedist.  When he is active he does not experience chest pain, shortness of breath, palpitations, edema, or fatigue.  Past Medical History:  Diagnosis Date   Headache    Past Surgical History:  Procedure Laterality Date   knee       reports that he has never smoked. He has never used smokeless tobacco. He reports that he does not drink alcohol and does not use drugs. family history includes Breast cancer in his mother; Diabetes in his mother. Allergies  Allergen Reactions   Benadryl [Diphenhydramine Hcl] Other (See Comments)    hallucinations    Penicillins Other (See Comments)    unknown     Outpatient Medications Prior to Visit  Medication Sig Dispense Refill   ibuprofen (ADVIL,MOTRIN) 600 MG tablet Take 1 tablet (600 mg total) by mouth every 6 (six) hours as needed. 30 tablet 0   traMADol (ULTRAM) 50 MG tablet Take 1 tablet (50 mg total) by mouth every 6 (six) hours as needed. 6 tablet 0   No facility-administered medications prior to visit.    ROS Review of Systems  Constitutional: Negative for diaphoresis, fatigue and unexpected weight change.  HENT: Negative.   Eyes: Negative for visual disturbance.  Respiratory: Negative for apnea, cough, shortness of breath and wheezing.   Cardiovascular: Negative for chest pain, palpitations and leg swelling.    Gastrointestinal: Negative for abdominal pain, constipation, diarrhea, nausea and vomiting.  Endocrine: Negative.   Genitourinary: Negative.  Negative for difficulty urinating, dysuria, scrotal swelling and testicular pain.  Musculoskeletal: Positive for arthralgias. Negative for back pain, myalgias and neck pain.       Chronic right knee pain  Skin: Negative.  Negative for color change, pallor and rash.  Neurological: Negative.  Negative for dizziness, weakness and light-headedness.  Hematological: Negative for adenopathy. Does not bruise/bleed easily.  Psychiatric/Behavioral: Negative.     Objective:  BP 118/80 (BP Location: Left Arm, Patient Position: Sitting, Cuff Size: Large)    Pulse 77    Temp 98.2 F (36.8 C) (Oral)    Resp 16    Ht 5\' 8"  (1.727 m)    Wt 274 lb 8 oz (124.5 kg)    SpO2 97%    BMI 41.74 kg/m   BP Readings from Last 3 Encounters:  08/12/19 118/80  05/31/16 105/63  12/23/14 (!) 102/53    Wt Readings from Last 3 Encounters:  08/12/19 274 lb 8 oz (124.5 kg)  12/23/14 250 lb (113.4 kg)    Physical Exam Vitals reviewed. Exam conducted with a chaperone present Everette Rank).  Constitutional:      Appearance: He is obese.  HENT:     Nose: Nose normal.     Mouth/Throat:     Mouth: Mucous membranes are moist.  Eyes:     General: No scleral icterus.    Conjunctiva/sclera: Conjunctivae normal.  Cardiovascular:  Rate and Rhythm: Normal rate and regular rhythm.     Heart sounds: No murmur heard.   Pulmonary:     Effort: Pulmonary effort is normal.     Breath sounds: No stridor. No wheezing, rhonchi or rales.  Abdominal:     General: Abdomen is protuberant. Bowel sounds are normal. There is no distension.     Palpations: Abdomen is soft. There is no hepatomegaly, splenomegaly or mass.     Tenderness: There is no abdominal tenderness. There is no guarding.     Hernia: No hernia is present. There is no hernia in the left inguinal area or right inguinal  area.  Genitourinary:    Pubic Area: No rash.      Penis: Normal and circumcised. No discharge, swelling or lesions.      Testes: Normal.        Right: Mass, tenderness or swelling not present.        Left: Mass, tenderness or swelling not present.     Epididymis:     Right: Normal. Not inflamed or enlarged.     Left: Normal. Not inflamed or enlarged.     Prostate: Normal. Not enlarged, not tender and no nodules present.     Rectum: Normal. Guaiac result negative. No mass, tenderness, anal fissure, external hemorrhoid or internal hemorrhoid. Normal anal tone.  Musculoskeletal:        General: Normal range of motion.     Cervical back: Neck supple.     Right lower leg: No edema.     Left lower leg: No edema.  Lymphadenopathy:     Cervical: No cervical adenopathy.     Lower Body: No right inguinal adenopathy. No left inguinal adenopathy.  Skin:    General: Skin is warm and dry.     Findings: No rash.  Neurological:     General: No focal deficit present.     Mental Status: He is alert and oriented to person, place, and time. Mental status is at baseline.  Psychiatric:        Mood and Affect: Mood normal.        Behavior: Behavior normal.     Lab Results  Component Value Date   WBC 9.1 08/12/2019   HGB 14.3 08/12/2019   HCT 43.4 08/12/2019   PLT 315.0 08/12/2019   GLUCOSE 98 08/12/2019   CHOL 217 (H) 08/12/2019   TRIG 335.0 (H) 08/12/2019   HDL 30.40 (L) 08/12/2019   LDLDIRECT 131.0 08/12/2019   ALT 23 08/12/2019   AST 18 08/12/2019   NA 137 08/12/2019   K 4.1 08/12/2019   CL 100 08/12/2019   CREATININE 0.97 08/12/2019   BUN 14 08/12/2019   CO2 28 08/12/2019   TSH 0.87 08/12/2019   PSA 1.27 08/12/2019   HGBA1C 5.9 08/12/2019    No results found.  Assessment & Plan:   Delmore was seen today for annual exam.  Diagnoses and all orders for this visit:  Routine general medical examination at a health care facility- Exam completed, labs reviewed - Statin therapy is  not indicated, vaccines reviewed and updated, cancer screenings are up-to-date, patient education was given. -     Lipid panel; Future -     PSA; Future -     PSA -     Lipid panel  Class 3 severe obesity due to excess calories with serious comorbidity and body mass index (BMI) of 40.0 to 44.9 in adult [facility])- Labs are negative for secondary causes.  The only complications are high triglycerides and prediabetes.  He agrees to be more diligent about his lifestyle modifications. -     CBC with Differential/Platelet; Future -     Basic metabolic panel; Future -     Hepatic function panel; Future -     Hemoglobin A1c; Future -     TSH; Future -     TSH -     Hemoglobin A1c -     Hepatic function panel -     Basic metabolic panel -     CBC with Differential/Platelet  Hyperglycemia- He is mildly prediabetic with an A1c of 5.9%.  He agrees to improve his lifestyle modifications. -     Basic metabolic panel; Future -     Hemoglobin A1c; Future -     Hemoglobin A1c -     Basic metabolic panel  Need for Tdap vaccination -     Tdap vaccine greater than or equal to 7yo IM  Other orders -     LDL cholesterol, direct   I have discontinued Jalin Q. Kusek "Quentin"'s ibuprofen and traMADol.  No orders of the defined types were placed in this encounter.    Follow-up: Return in about 6 months (around 02/11/2020).  Sanda Linger, MD

## 2019-08-12 NOTE — Patient Instructions (Signed)

## 2019-08-13 ENCOUNTER — Encounter: Payer: Self-pay | Admitting: Internal Medicine

## 2019-08-13 LAB — HEMOGLOBIN A1C: Hgb A1c MFr Bld: 5.9 % (ref 4.6–6.5)

## 2019-10-04 ENCOUNTER — Ambulatory Visit (HOSPITAL_COMMUNITY)
Admission: EM | Admit: 2019-10-04 | Discharge: 2019-10-04 | Disposition: A | Discharge status: Home / Self Care | Payer: 59 | Attending: Family Medicine | Admitting: Family Medicine

## 2019-10-04 ENCOUNTER — Encounter (HOSPITAL_COMMUNITY): Payer: Self-pay | Admitting: Emergency Medicine

## 2019-10-04 DIAGNOSIS — H01002 Unspecified blepharitis right lower eyelid: Secondary | ICD-10-CM | POA: Diagnosis not present

## 2019-10-04 MED ORDER — ERYTHROMYCIN 5 MG/GM OP OINT: 1.0000 "application " | TOPICAL_OINTMENT | Freq: Four times a day (QID) | OPHTHALMIC | 0 refills | Status: AC

## 2019-10-04 NOTE — ED Triage Notes (Signed)
Pt c/o right eye pain and swelling onset yesterday. Pt states eye was a little more swollen this morning and he is concerned.

## 2019-10-04 NOTE — Discharge Instructions (Signed)
Warm compresses.  Gentle lid scrubs with wash cloth/ soap and water, daily.  Use of antibiotic ointment as prescribed.  If symptoms worsen or do not improve in the next week to return to be seen or to follow up with your PCP or eye doctor

## 2019-10-04 NOTE — ED Provider Notes (Signed)
[facility]    CSN: [id number] Arrival date & time: 10/04/19  0809      History   Chief Complaint Chief Complaint  Patient presents with  . Facial Swelling    HPI Shawn Pruitt is a 44 y.o. male.   Shawn Pruitt presents with complaints of swelling to right lower eye lid. He noticed it mildly last night, but worse this morning when he woke. Has improved some this morning. No eye ball pain, itching or drainage. No known exposures from allergens, products, bug bits or trauma to the area. Denies  Any previous similar. No vision changes. No other URI symptoms. Doesn't wear glasses or contacts.     ROS per HPI, negative if not otherwise mentioned.      Past Medical History:  Diagnosis Date  . Headache     Patient Active Problem List   Diagnosis Date Noted  . Hyperglycemia 08/12/2019  . Class 3 severe obesity due to excess calories with serious comorbidity and body mass index (BMI) of 40.0 to 44.9 in adult (HCC) 08/12/2019  . Routine general medical examination at a health care facility 08/12/2019    Past Surgical History:  Procedure Laterality Date  . knee          Home Medications    Prior to Admission medications   Medication Sig Start Date End Date Taking? Authorizing Provider  erythromycin ophthalmic ointment Place 1 application into the right eye 4 (four) times daily for 7 days. 10/04/19 10/11/19  Georgetta Haber, NP    Family History Family History  Problem Relation Age of Onset  . Breast cancer Mother   . Diabetes Mother     Social History Social History   Tobacco Use  . Smoking status: Never Smoker  . Smokeless tobacco: Never Used  Vaping Use  . Vaping Use: Never used  Substance Use Topics  . Alcohol use: No  . Drug use: No     Allergies   Benadryl [diphenhydramine hcl] and Penicillins   Review of Systems Review of Systems   Physical Exam Triage Vital Signs ED Triage Vitals  Enc Vitals Group     BP 10/04/19 0906  124/83     Pulse Rate 10/04/19 0906 80     Resp 10/04/19 0906 16     Temp 10/04/19 0906 98.7 F (37.1 C)     Temp Source 10/04/19 0906 Oral     SpO2 10/04/19 0906 97 %     Weight --      Height --      Head Circumference --      Peak Flow --      Pain Score 10/04/19 0903 1     Pain Loc --      Pain Edu? --      Excl. in GC? --    No data found.  Updated Vital Signs BP 124/83 (BP Location: Left Arm)   Pulse 80   Temp 98.7 F (37.1 C) (Oral)   Resp 16   SpO2 97%   Visual Acuity Right Eye Distance:   Left Eye Distance:   Bilateral Distance:    Right Eye Near:   Left Eye Near:    Bilateral Near:     Physical Exam Constitutional:      Appearance: He is well-developed.  Eyes:     General: Vision grossly intact.     Extraocular Movements: Extraocular movements intact.     Conjunctiva/sclera: Conjunctivae normal.  Pupils: Pupils are equal, round, and reactive to light.      Comments: Very mild swelling to right lower lateral lid, originating at lid line, with very mild extension to distal lower lid. Non tender; no drainage visualized  Cardiovascular:     Rate and Rhythm: Normal rate.  Pulmonary:     Effort: Pulmonary effort is normal.  Skin:    General: Skin is warm and dry.  Neurological:     Mental Status: He is alert and oriented to person, place, and time.      UC Treatments / Results  Labs (all labs ordered are listed, but only abnormal results are displayed) Labs Reviewed - No data to display  EKG   Radiology No results found.  Procedures Procedures (including critical care time)  Medications Ordered in UC Medications - No data to display  Initial Impression / Assessment and Plan / UC Course  I have reviewed the triage vital signs and the nursing notes.  Pertinent labs & imaging results that were available during my care of the patient were reviewed by me and considered in my medical decision making (see chart for details).     Left  lower lid blepharitis with topical erythromycin provided. Encouraged lash scrubs and compresses. Return precautions provided. Patient verbalized understanding and agreeable to plan.   Final Clinical Impressions(s) / UC Diagnoses   Final diagnoses:  Blepharitis of right lower eyelid, unspecified type     Discharge Instructions     Warm compresses.  Gentle lid scrubs with wash cloth/ soap and water, daily.  Use of antibiotic ointment as prescribed.  If symptoms worsen or do not improve in the next week to return to be seen or to follow up with your PCP or eye doctor      ED Prescriptions    Medication Sig Dispense Auth. Provider   erythromycin ophthalmic ointment Place 1 application into the right eye 4 (four) times daily for 7 days. 28 g Georgetta Haber, NP     PDMP not reviewed this encounter.   Georgetta Haber, NP 10/04/19 [phone]

## 2022-01-29 ENCOUNTER — Ambulatory Visit (HOSPITAL_COMMUNITY)
Admission: EM | Admit: 2022-01-29 | Discharge: 2022-01-29 | Disposition: A | Discharge status: Home / Self Care | Payer: 59 | Attending: Emergency Medicine | Admitting: Emergency Medicine

## 2022-01-29 ENCOUNTER — Encounter (HOSPITAL_COMMUNITY): Payer: Self-pay

## 2022-01-29 DIAGNOSIS — B9689 Other specified bacterial agents as the cause of diseases classified elsewhere: Secondary | ICD-10-CM

## 2022-01-29 DIAGNOSIS — L237 Allergic contact dermatitis due to plants, except food: Secondary | ICD-10-CM

## 2022-01-29 DIAGNOSIS — L089 Local infection of the skin and subcutaneous tissue, unspecified: Secondary | ICD-10-CM | POA: Diagnosis not present

## 2022-01-29 MED ORDER — MUPIROCIN CALCIUM 2 % EX CREA: 1.0000 | TOPICAL_CREAM | Freq: Two times a day (BID) | CUTANEOUS | 0 refills | Status: DC

## 2022-01-29 MED ORDER — PREDNISONE 10 MG (21) PO TBPK: ORAL_TABLET | Freq: Every day | ORAL | 0 refills | Status: DC

## 2022-01-29 NOTE — ED Provider Notes (Signed)
[facility]    CSN: [id number] Arrival date & time: 01/29/22  1621     History   Chief Complaint Chief Complaint  Patient presents with   Rash    HPI Shawn Pruitt is a 46 y.o. male.  Presents with rash that began 3 days ago Was working in the yard, thinks he had poison ivy exposure Reports left ear, neck, face, fingers are affected Has been itching despite topical hydrocortisone  Denies any shortness of breath, trouble breathing, tongue or lip swelling.  Denies history of DM or hyperglycemia  Past Medical History:  Diagnosis Date   Headache     Patient Active Problem List   Diagnosis Date Noted   Hyperglycemia 08/12/2019   Class 3 severe obesity due to excess calories with serious comorbidity and body mass index (BMI) of 40.0 to 44.9 in adult [facility]) 08/12/2019   Routine general medical examination at a health care facility 08/12/2019    Past Surgical History:  Procedure Laterality Date   knee          Home Medications    Prior to Admission medications   Medication Sig Start Date End Date Taking? Authorizing Provider  mupirocin cream (BACTROBAN) 2 % Apply 1 Application topically 2 (two) times daily. 01/29/22  Yes Curran Lenderman, Lurena Joiner, PA-C  predniSONE (STERAPRED UNI-PAK 21 TAB) 10 MG (21) TBPK tablet Take by mouth daily. Take 6 tabs by mouth daily for 1 day, then 5 tabs for 1 day, then 4 tabs for 1 day, then 3 tabs for 1 day, 2 tabs for 1 day, then 1 tab by mouth daily for 1 day 01/29/22  Yes Rozlyn Yerby, Lurena Joiner, PA-C    Family History Family History  Problem Relation Age of Onset   Breast cancer Mother    Diabetes Mother     Social History Social History   Tobacco Use   Smoking status: Never   Smokeless tobacco: Never  Vaping Use   Vaping Use: Never used  Substance Use Topics   Alcohol use: No   Drug use: No     Allergies   Benadryl [diphenhydramine hcl] and Penicillins   Review of Systems Review of Systems  Skin:  Positive for  rash.   As per HPI  Physical Exam Triage Vital Signs ED Triage Vitals [01/29/22 1708]  Enc Vitals Group     BP 132/88     Pulse Rate 91     Resp 18     Temp 98.2 F (36.8 C)     Temp Source Oral     SpO2 95 %     Weight      Height      Head Circumference      Peak Flow      Pain Score 0     Pain Loc      Pain Edu?      Excl. in GC?    No data found.  Updated Vital Signs BP 132/88 (BP Location: Left Arm)   Pulse 91   Temp 98.2 F (36.8 C) (Oral)   Resp 18   SpO2 95%    Physical Exam Vitals and nursing note reviewed.  Constitutional:      General: He is not in acute distress.    Appearance: Normal appearance.  HENT:     Mouth/Throat:     Mouth: Mucous membranes are moist.     Pharynx: Oropharynx is clear. No posterior oropharyngeal erythema.  Eyes:     Conjunctiva/sclera: Conjunctivae  normal.     Pupils: Pupils are equal, round, and reactive to light.  Cardiovascular:     Rate and Rhythm: Normal rate and regular rhythm.     Pulses: Normal pulses.     Heart sounds: Normal heart sounds.  Pulmonary:     Effort: Pulmonary effort is normal. No respiratory distress.     Breath sounds: Normal breath sounds. No wheezing.  Abdominal:     General: Bowel sounds are normal.     Tenderness: There is no abdominal tenderness.  Musculoskeletal:        General: Normal range of motion.     Cervical back: Normal range of motion.  Skin:    Findings: Rash present.     Comments: Poison ivy rash to the left cheek, forehead, ear.  Some down the left neck.  Between fingers of left hand.  There is an area on the left external ear with some crusting, weeping that may be secondary bacterial  Neurological:     Mental Status: He is alert and oriented to person, place, and time.    UC Treatments / Results  Labs (all labs ordered are listed, but only abnormal results are displayed) Labs Reviewed - No data to display  EKG  Radiology No results found.  Procedures Procedures    Medications Ordered in UC Medications - No data to display  Initial Impression / Assessment and Plan / UC Course  I have reviewed the triage vital signs and the nursing notes.  Pertinent labs & imaging results that were available during my care of the patient were reviewed by me and considered in my medical decision making (see chart for details).  With moderate facial involvement, treating with prednisone 6 day taper Additionally will use Bactroban twice daily on the ear to cover for potential superficial bacterial infection in that area Discussed return precautions including worsening of symptoms despite medication. Patient agrees to plan  Final Clinical Impressions(s) / UC Diagnoses   Final diagnoses:  Poison ivy dermatitis  Bacterial skin infection     Discharge Instructions      Please take the prednisone as prescribed.  Use the cream twice daily to the ear.  Please return if symptoms persist despite these medications    ED Prescriptions     Medication Sig Dispense Auth. Provider   predniSONE (STERAPRED UNI-PAK 21 TAB) 10 MG (21) TBPK tablet Take by mouth daily. Take 6 tabs by mouth daily for 1 day, then 5 tabs for 1 day, then 4 tabs for 1 day, then 3 tabs for 1 day, 2 tabs for 1 day, then 1 tab by mouth daily for 1 day 21 tablet Keelia Graybill, PA-C   mupirocin cream (BACTROBAN) 2 % Apply 1 Application topically 2 (two) times daily. 15 g Juma Oxley, Lurena Joiner, PA-C      PDMP not reviewed this encounter.   Anouk Critzer, Lurena Joiner, [place] 01/29/22 1757

## 2022-01-29 NOTE — Discharge Instructions (Addendum)
Please take the prednisone as prescribed.  Use the cream twice daily to the ear.  Please return if symptoms persist despite these medications

## 2022-01-29 NOTE — ED Triage Notes (Signed)
Pt c/o rash to lt ear/neck and between fingers on lt hand since Saturday after working in the yard. States using OTC cream with no relief.

## 2022-05-02 ENCOUNTER — Ambulatory Visit (HOSPITAL_COMMUNITY)
Admission: EM | Admit: 2022-05-02 | Discharge: 2022-05-02 | Disposition: A | Discharge status: Home / Self Care | Payer: 59 | Attending: Internal Medicine | Admitting: Internal Medicine

## 2022-05-02 ENCOUNTER — Encounter (HOSPITAL_COMMUNITY): Payer: Self-pay

## 2022-05-02 ENCOUNTER — Ambulatory Visit (INDEPENDENT_AMBULATORY_CARE_PROVIDER_SITE_OTHER): Payer: 59

## 2022-05-02 DIAGNOSIS — R059 Cough, unspecified: Secondary | ICD-10-CM

## 2022-05-02 DIAGNOSIS — R509 Fever, unspecified: Secondary | ICD-10-CM

## 2022-05-02 DIAGNOSIS — Z1152 Encounter for screening for COVID-19: Secondary | ICD-10-CM | POA: Diagnosis not present

## 2022-05-02 DIAGNOSIS — J40 Bronchitis, not specified as acute or chronic: Secondary | ICD-10-CM | POA: Insufficient documentation

## 2022-05-02 DIAGNOSIS — R0989 Other specified symptoms and signs involving the circulatory and respiratory systems: Secondary | ICD-10-CM

## 2022-05-02 MED ORDER — IPRATROPIUM-ALBUTEROL 0.5-2.5 (3) MG/3ML IN SOLN
3.0000 mL | Freq: Once | RESPIRATORY_TRACT | Status: AC
  Administered 2022-05-02: 3 mL via RESPIRATORY_TRACT

## 2022-05-02 MED ORDER — PREDNISONE 20 MG PO TABS: 20.0000 mg | ORAL_TABLET | Freq: Every day | ORAL | 0 refills | Status: DC

## 2022-05-02 MED ORDER — AZITHROMYCIN 250 MG PO TABS: ORAL_TABLET | ORAL | 0 refills | Status: DC

## 2022-05-02 MED ORDER — IPRATROPIUM-ALBUTEROL 0.5-2.5 (3) MG/3ML IN SOLN
RESPIRATORY_TRACT | Status: AC
  Filled 2022-05-02: qty 3

## 2022-05-02 MED ORDER — ALBUTEROL SULFATE HFA 108 (90 BASE) MCG/ACT IN AERS: 2.0000 | INHALATION_SPRAY | RESPIRATORY_TRACT | 0 refills | Status: DC | PRN

## 2022-05-02 NOTE — ED Triage Notes (Signed)
Pt presents with chest congestion and fever that started on Monday and is not subsiding.

## 2022-05-02 NOTE — ED Provider Notes (Signed)
[place]    CSN: [id number] Arrival date & time: 05/02/22  1642      History   Chief Complaint Chief Complaint  Patient presents with   Fever    HPI Shawn Pruitt is a 47 y.o. male who presents with chest congestion and fever x 4 days and is not getting better. He denies body aches or HA. Has mild fatigue, but his appetite is normal. Started with just mild cold symptoms, but yesterday started wheezing a little, and today had a fever of 103 and the cough has been worse. The cough is productive with cream to brown mucous. Has rhinitis and PND as well.     Past Medical History:  Diagnosis Date   Headache     Patient Active Problem List   Diagnosis Date Noted   Hyperglycemia 08/12/2019   Class 3 severe obesity due to excess calories with serious comorbidity and body mass index (BMI) of 40.0 to 44.9 in adult [facility]) 08/12/2019   Routine general medical examination at a health care facility 08/12/2019    Past Surgical History:  Procedure Laterality Date   knee          Home Medications    Prior to Admission medications   Medication Sig Start Date End Date Taking? Authorizing Provider  albuterol (VENTOLIN HFA) 108 (90 Base) MCG/ACT inhaler Inhale 2 puffs into the lungs every 4 (four) hours as needed for wheezing or shortness of breath. 05/02/22  Yes Rodriguez-Southworth, Sunday Spillers, PA-C  azithromycin (ZITHROMAX Z-PAK) 250 MG tablet 2 today, then one qd x 4 days 05/02/22  Yes Rodriguez-Southworth, Sunday Spillers, PA-C  predniSONE (DELTASONE) 20 MG tablet Take 1 tablet (20 mg total) by mouth daily with breakfast. 05/02/22  Yes Rodriguez-Southworth, Sunday Spillers, PA-C    Family History Family History  Problem Relation Age of Onset   Breast cancer Mother    Diabetes Mother     Social History Social History   Tobacco Use   Smoking status: Never   Smokeless tobacco: Never  Vaping Use   Vaping Use: Never used  Substance Use Topics   Alcohol use: No   Drug use: No      Allergies   Benadryl [diphenhydramine hcl] and Penicillins   Review of Systems Review of Systems As noted in HPI  Physical Exam Triage Vital Signs ED Triage Vitals  Enc Vitals Group     BP 05/02/22 1812 135/76     Pulse Rate 05/02/22 1812 (!) 117     Resp 05/02/22 1812 20     Temp 05/02/22 1812 98.8 F (37.1 C)     Temp src --      SpO2 05/02/22 1812 94 %     Weight --      Height --      Head Circumference --      Peak Flow --      Pain Score 05/02/22 1811 0     Pain Loc --      Pain Edu? --      Excl. in [place]? --    No data found.  Updated Vital Signs BP 135/76   Pulse (!) 117   Temp 98.8 F (37.1 C)   Resp 20   SpO2 94%  Repeated pulse ox abter duo neb 96% Visual Acuity Right Eye Distance:   Left Eye Distance:   Bilateral Distance:    Right Eye Near:   Left Eye Near:    Bilateral Near:     Physical  Exam Physical Exam Constitutional:      General: He is not in acute distress.    Appearance: He is not toxic-appearing.  HENT:     Head: Normocephalic.     Right Ear: Tympanic membrane, ear canal and external ear normal.     Left Ear: Ear canal and external ear normal.     Nose: Nose normal.     Mouth/Throat: clear    Mouth: Mucous membranes are moist.     Pharynx: Oropharynx is clear.  Eyes:     General: No scleral icterus.    Conjunctiva/sclera: Conjunctivae normal.  Cardiovascular:     Rate and Rhythm: Normal rate and regular rhythm.     Heart sounds: No murmur heard.   Pulmonary:     Effort: Pulmonary effort is normal. No respiratory distress.     Breath sounds: Wheezing present on RUL which resolved after duo neb treatment. His pulse ox went up to 96%    Comments: Has a wheezie cough  Musculoskeletal:        General: Normal range of motion.     Cervical back: Neck supple.  Lymphadenopathy:     Cervical: No cervical adenopathy.  Skin:    General: Skin is warm and dry.     Findings: No rash.  Neurological:     Mental Status: He is  alert and oriented to person, place, and time.     Gait: Gait normal.  Psychiatric:        Mood and Affect: Mood normal.        Behavior: Behavior normal.        Thought Content: Thought content normal.        Judgment: Judgment normal.    UC Treatments / Results  Labs (all labs ordered are listed, but only abnormal results are displayed) Labs Reviewed  SARS CORONAVIRUS 2 (TAT 6-24 HRS)    EKG   Radiology DG Chest 2 View  Result Date: 05/02/2022 CLINICAL DATA:  Cough, fever, chest congestion EXAM: CHEST - 2 VIEW COMPARISON:  None Available. FINDINGS: The heart size and mediastinal contours are within normal limits. Both lungs are clear. The visualized skeletal structures are unremarkable. IMPRESSION: No active cardiopulmonary disease. Electronically Signed   By: Fidela Salisbury M.D.   On: 05/02/2022 18:57    Procedures Procedures (including critical care time)  Medications Ordered in UC Medications  ipratropium-albuterol (DUONEB) 0.5-2.5 (3) MG/3ML nebulizer solution 3 mL (3 mLs Nebulization Given 05/02/22 1932)    Initial Impression / Assessment and Plan / UC Course  I have reviewed the triage vital signs and the nursing notes.  Pertinent  imaging results that were available during my care of the patient were reviewed by me and considered in my medical decision making (see chart for details).  Covid test was ordered and we will inform him of his result if positive when is back.    Bronchitis  I placed him on Albuterol inhaler, Zpack, and prednisone as noted.    Final Clinical Impressions(s) / UC Diagnoses   Final diagnoses:  Bronchitis     Discharge Instructions      We will call you if your covid test is positive. If so, you may return to work next Monday but wear a mask for 3 more days.      ED Prescriptions     Medication Sig Dispense Auth. Provider   albuterol (VENTOLIN HFA) 108 (90 Base) MCG/ACT inhaler Inhale 2 puffs into the lungs every 4 (four)  hours as needed for wheezing or shortness of breath. 18 g Rodriguez-Southworth, [place] Chelf, PA-C   azithromycin (ZITHROMAX Z-PAK) 250 MG tablet 2 today, then one qd x 4 days 6 tablet Rodriguez-Southworth, Aliyana Dlugosz, PA-C   predniSONE (DELTASONE) 20 MG tablet Take 1 tablet (20 mg total) by mouth daily with breakfast. 5 tablet Rodriguez-Southworth, Sunday Spillers, PA-C      PDMP not reviewed this encounter.   Shelby Mattocks, Hershal Coria 05/02/22 2045

## 2022-05-02 NOTE — Discharge Instructions (Signed)
We will call you if your covid test is positive. If so, you may return to work next Monday but wear a mask for 3 more days.

## 2022-05-03 LAB — SARS CORONAVIRUS 2 (TAT 6-24 HRS): SARS Coronavirus 2: NEGATIVE

## 2023-02-13 ENCOUNTER — Encounter: Payer: Self-pay | Admitting: Internal Medicine

## 2023-02-13 ENCOUNTER — Ambulatory Visit: Payer: 59 | Admitting: Internal Medicine

## 2023-02-13 VITALS — BP 142/66 | HR 97 | Temp 98.3°F | Resp 16 | Ht 68.0 in | Wt 292.4 lb

## 2023-02-13 DIAGNOSIS — Z0001 Encounter for general adult medical examination with abnormal findings: Secondary | ICD-10-CM

## 2023-02-13 DIAGNOSIS — Z23 Encounter for immunization: Secondary | ICD-10-CM | POA: Diagnosis not present

## 2023-02-13 DIAGNOSIS — R0609 Other forms of dyspnea: Secondary | ICD-10-CM | POA: Diagnosis not present

## 2023-02-13 DIAGNOSIS — I10 Essential (primary) hypertension: Secondary | ICD-10-CM | POA: Diagnosis not present

## 2023-02-13 DIAGNOSIS — R1012 Left upper quadrant pain: Secondary | ICD-10-CM | POA: Diagnosis not present

## 2023-02-13 DIAGNOSIS — R0683 Snoring: Secondary | ICD-10-CM

## 2023-02-13 DIAGNOSIS — Z1211 Encounter for screening for malignant neoplasm of colon: Secondary | ICD-10-CM | POA: Insufficient documentation

## 2023-02-13 DIAGNOSIS — R9431 Abnormal electrocardiogram [ECG] [EKG]: Secondary | ICD-10-CM

## 2023-02-13 DIAGNOSIS — R7303 Prediabetes: Secondary | ICD-10-CM

## 2023-02-13 DIAGNOSIS — Z1159 Encounter for screening for other viral diseases: Secondary | ICD-10-CM | POA: Insufficient documentation

## 2023-02-13 LAB — HEPATIC FUNCTION PANEL
ALT: 17 U/L (ref 0–53)
AST: 15 U/L (ref 0–37)
Albumin: 4 g/dL (ref 3.5–5.2)
Alkaline Phosphatase: 96 U/L (ref 39–117)
Bilirubin, Direct: 0.1 mg/dL (ref 0.0–0.3)
Total Bilirubin: 0.8 mg/dL (ref 0.2–1.2)
Total Protein: 7.5 g/dL (ref 6.0–8.3)

## 2023-02-13 LAB — CBC WITH DIFFERENTIAL/PLATELET
Basophils Absolute: 0 10*3/uL (ref 0.0–0.1)
Basophils Relative: 0.6 % (ref 0.0–3.0)
Eosinophils Absolute: 0.2 10*3/uL (ref 0.0–0.7)
Eosinophils Relative: 2.5 % (ref 0.0–5.0)
HCT: 45.1 % (ref 39.0–52.0)
Hemoglobin: 14.6 g/dL (ref 13.0–17.0)
Lymphocytes Relative: 33.5 % (ref 12.0–46.0)
Lymphs Abs: 2.3 10*3/uL (ref 0.7–4.0)
MCHC: 32.4 g/dL (ref 30.0–36.0)
MCV: 79.3 fL (ref 78.0–100.0)
Monocytes Absolute: 0.6 10*3/uL (ref 0.1–1.0)
Monocytes Relative: 8.6 % (ref 3.0–12.0)
Neutro Abs: 3.7 10*3/uL (ref 1.4–7.7)
Neutrophils Relative %: 54.8 % (ref 43.0–77.0)
Platelets: 325 10*3/uL (ref 150.0–400.0)
RBC: 5.69 Mil/uL (ref 4.22–5.81)
RDW: 14.4 % (ref 11.5–15.5)
WBC: 6.8 10*3/uL (ref 4.0–10.5)

## 2023-02-13 LAB — BASIC METABOLIC PANEL
BUN: 12 mg/dL (ref 6–23)
CO2: 26 meq/L (ref 19–32)
Calcium: 8.8 mg/dL (ref 8.4–10.5)
Chloride: 102 meq/L (ref 96–112)
Creatinine, Ser: 0.93 mg/dL (ref 0.40–1.50)
GFR: 98.3 mL/min (ref >60.00)
Glucose, Bld: 94 mg/dL (ref 70–99)
Potassium: 4.2 meq/L (ref 3.5–5.1)
Sodium: 137 meq/L (ref 135–145)

## 2023-02-13 LAB — URINALYSIS, ROUTINE W REFLEX MICROSCOPIC
Bilirubin Urine: NEGATIVE
Ketones, ur: NEGATIVE
Leukocytes,Ua: NEGATIVE
Nitrite: NEGATIVE
Specific Gravity, Urine: 1.02 (ref 1.000–1.030)
Total Protein, Urine: NEGATIVE
Urine Glucose: NEGATIVE
Urobilinogen, UA: 1 (ref 0.0–1.0)
pH: 6 (ref 5.0–8.0)

## 2023-02-13 LAB — LIPID PANEL
Cholesterol: 213 mg/dL — ABNORMAL HIGH (ref 0–200)
HDL: 30.4 mg/dL — ABNORMAL LOW (ref >39.00)
LDL Cholesterol: 120 mg/dL — ABNORMAL HIGH (ref 0–99)
NonHDL: 182.18
Total CHOL/HDL Ratio: 7
Triglycerides: 313 mg/dL — ABNORMAL HIGH (ref 0.0–149.0)
VLDL: 62.6 mg/dL — ABNORMAL HIGH (ref 0.0–40.0)

## 2023-02-13 LAB — TROPONIN I (HIGH SENSITIVITY): High Sens Troponin I: 15 ng/L (ref 2–17)

## 2023-02-13 LAB — HEMOGLOBIN A1C: Hgb A1c MFr Bld: 6.1 % (ref 4.6–6.5)

## 2023-02-13 LAB — TSH: TSH: 1.18 u[IU]/mL (ref 0.35–5.50)

## 2023-02-13 LAB — BRAIN NATRIURETIC PEPTIDE: Pro B Natriuretic peptide (BNP): 9 pg/mL (ref 0.0–100.0)

## 2023-02-13 LAB — PSA: PSA: 2.62 ng/mL (ref 0.10–4.00)

## 2023-02-13 NOTE — Progress Notes (Unsigned)
Subjective:  Patient ID: Shawn Pruitt, male    DOB: Mar 09, 1975  Age: 47 y.o. MRN: [id number]  CC: Abdominal Pain and Annual Exam   HPI Shawn Pruitt presents for a CPX and f/up ----  Discussed the use of AI scribe software for clinical note transcription with the patient, who gave verbal consent to proceed.  History of Present Illness   The patient, with a history of knee surgery due to a torn meniscus, presents with intermittent dull pain in his LUQ that has been present for several months. He denies any associated nausea, vomiting, weight loss, loss of appetite, or changes in bowel or urinary habits. He complains of DOE but denies CP, diaphoresis, edema.   The patient's knee, post-surgery, has never fully recovered, leading to decreased activity levels. Over-the-counter Advil is taken for pain management, and he also takes medication for occasional migraines.  The patient has been experiencing sleep disturbances, often staying up late and having difficulty falling asleep. He denies any restorative sleep issues.    The patient has noticed gradual weight gain and acknowledges the need for increased physical activity. He had previously been working out intensively, which led to the knee injury. He has no complaints of leg or foot swelling beyond what he attributes to his weight gain.  He denies any blood in his stool.       History Shawn Pruitt has a past medical history of Headache.   He has a past surgical history that includes knee .   His family history includes Breast cancer in his mother; Diabetes in his mother.He reports that he has never smoked. He has never used smokeless tobacco. He reports that he does not drink alcohol and does not use drugs.  Outpatient Medications Prior to Visit  Medication Sig Dispense Refill   albuterol (VENTOLIN HFA) 108 (90 Base) MCG/ACT inhaler Inhale 2 puffs into the lungs every 4 (four) hours as needed for wheezing or shortness of breath. 18 g 0    azithromycin (ZITHROMAX Z-PAK) 250 MG tablet 2 today, then one qd x 4 days 6 tablet 0   predniSONE (DELTASONE) 20 MG tablet Take 1 tablet (20 mg total) by mouth daily with breakfast. 5 tablet 0   No facility-administered medications prior to visit.    ROS Review of Systems  Constitutional:  Positive for fatigue and unexpected weight change (weight gain).  Respiratory:  Positive for shortness of breath (DOE). Negative for cough, choking, chest tightness and wheezing.   Cardiovascular:  Negative for chest pain, palpitations and leg swelling.  Gastrointestinal:  Positive for abdominal pain. Negative for abdominal distention, blood in stool, constipation, diarrhea, nausea, rectal pain and vomiting.  Musculoskeletal:  Positive for arthralgias. Negative for myalgias.  Skin: Negative.   Neurological: Negative.   Hematological:  Negative for adenopathy. Does not bruise/bleed easily.  Psychiatric/Behavioral:  Positive for sleep disturbance.        He takes frequent naps    Objective:  BP (!) 142/66 (BP Location: Left Arm, Patient Position: Sitting, Cuff Size: Normal)   Pulse 97   Temp 98.3 F (36.8 C) (Oral)   Resp 16   Ht 5\' 8"  (1.727 m)   Wt 292 lb 6.4 oz (132.6 kg)   SpO2 95%   BMI 44.46 kg/m   Physical Exam Vitals reviewed.  Constitutional:      Appearance: Normal appearance.  HENT:     Mouth/Throat:     Mouth: Mucous membranes are moist.  Eyes:  Conjunctiva/sclera: Conjunctivae normal.  Cardiovascular:     Rate and Rhythm: Normal rate and regular rhythm.     Heart sounds: No murmur heard.    Comments: EKG- NSR, 89 bpm Anterior infarct pattern No LVH or Q waves No old tracings Pulmonary:     Breath sounds: No stridor. No wheezing, rhonchi or rales.  Abdominal:     General: Abdomen is protuberant. Bowel sounds are normal. There is no distension.     Palpations: Abdomen is soft. There is no hepatomegaly, splenomegaly or mass.     Tenderness: There is no abdominal  tenderness.     Hernia: No hernia is present. There is no hernia in the left inguinal area or right inguinal area.  Genitourinary:    Pubic Area: No rash.      Penis: Normal and circumcised.      Testes: Normal.     Epididymis:     Right: Normal.     Left: Normal.  Musculoskeletal:        General: No swelling. Normal range of motion.     Cervical back: Neck supple.     Right lower leg: No edema.     Left lower leg: No edema.  Lymphadenopathy:     Cervical: No cervical adenopathy.     Lower Body: No right inguinal adenopathy. No left inguinal adenopathy.  Skin:    General: Skin is warm and dry.  Neurological:     General: No focal deficit present.     Mental Status: He is alert. Mental status is at baseline.  Psychiatric:        Mood and Affect: Mood normal.        Behavior: Behavior normal.        Thought Content: Thought content normal.        Judgment: Judgment normal.     Lab Results  Component Value Date   WBC 6.8 02/13/2023   HGB 14.6 02/13/2023   HCT 45.1 02/13/2023   PLT 325.0 02/13/2023   GLUCOSE 94 02/13/2023   CHOL 213 (H) 02/13/2023   TRIG 313.0 (H) 02/13/2023   HDL 30.40 (L) 02/13/2023   LDLDIRECT 131.0 08/12/2019   LDLCALC 120 (H) 02/13/2023   ALT 17 02/13/2023   AST 15 02/13/2023   NA 137 02/13/2023   K 4.2 02/13/2023   CL 102 02/13/2023   CREATININE 0.93 02/13/2023   BUN 12 02/13/2023   CO2 26 02/13/2023   TSH 1.18 02/13/2023   PSA 2.62 02/13/2023   HGBA1C 6.1 02/13/2023     Assessment & Plan:  Need for immunization against influenza -     Flu vaccine trivalent PF, 6mos and older(Flulaval,Afluria,Fluarix,Fluzone)  DOE (dyspnea on exertion) -     EKG 12-Lead -     CBC with Differential/Platelet; Future -     Troponin I (High Sensitivity); Future -     Brain natriuretic peptide; Future -     ECHOCARDIOGRAM COMPLETE; Future  Left upper quadrant abdominal pain  Systolic hypertension, isolated -     Urinalysis, Routine w reflex  microscopic; Future -     Basic metabolic panel; Future -     CBC with Differential/Platelet; Future  Encounter for general adult medical examination with abnormal findings -     Lipid panel; Future -     PSA; Future -     Hepatitis C antibody; Future  Morbid obesity (HCC) -     TSH; Future -     Hepatic function panel;  Future -     Hemoglobin A1c; Future -     Zepbound; Inject 2.5 mg into the skin once a week.  Dispense: 4 mL; Refill: 0  Loud snoring -     Ambulatory referral to Sleep Studies  Screening for colon cancer -     Cologuard  Need for hepatitis C screening test -     Hepatitis C antibody; Future  Abnormal electrocardiogram (ECG) (EKG) -     ECHOCARDIOGRAM COMPLETE; Future  Prediabetes      Follow-up: Return in about 6 months (around 08/14/2023).  Sanda Linger, MD

## 2023-02-13 NOTE — Patient Instructions (Signed)
Health Maintenance, Male Adopting a healthy lifestyle and getting preventive care are important in promoting health and wellness. Ask your health care provider about: The right schedule for you to have regular tests and exams. Things you can do on your own to prevent diseases and keep yourself healthy. What should I know about diet, weight, and exercise? Eat a healthy diet  Eat a diet that includes plenty of vegetables, fruits, low-fat dairy products, and lean protein. Do not eat a lot of foods that are high in solid fats, added sugars, or sodium. Maintain a healthy weight Body mass index (BMI) is a measurement that can be used to identify possible weight problems. It estimates body fat based on height and weight. Your health care provider can help determine your BMI and help you achieve or maintain a healthy weight. Get regular exercise Get regular exercise. This is one of the most important things you can do for your health. Most adults should: Exercise for at least 150 minutes each week. The exercise should increase your heart rate and make you sweat (moderate-intensity exercise). Do strengthening exercises at least twice a week. This is in addition to the moderate-intensity exercise. Spend less time sitting. Even light physical activity can be beneficial. Watch cholesterol and blood lipids Have your blood tested for lipids and cholesterol at 46 years of age, then have this test every 5 years. You may need to have your cholesterol levels checked more often if: Your lipid or cholesterol levels are high. You are older than 46 years of age. You are at high risk for heart disease. What should I know about cancer screening? Many types of cancers can be detected early and may often be prevented. Depending on your health history and family history, you may need to have cancer screening at various ages. This may include screening for: Colorectal cancer. Prostate cancer. Skin cancer. Lung  cancer. What should I know about heart disease, diabetes, and high blood pressure? Blood pressure and heart disease High blood pressure causes heart disease and increases the risk of stroke. This is more likely to develop in people who have high blood pressure readings or are overweight. Talk with your health care provider about your target blood pressure readings. Have your blood pressure checked: Every 3-5 years if you are 18-39 years of age. Every year if you are 40 years old or older. If you are between the ages of 65 and 75 and are a current or former smoker, ask your health care provider if you should have a one-time screening for abdominal aortic aneurysm (AAA). Diabetes Have regular diabetes screenings. This checks your fasting blood sugar level. Have the screening done: Once every three years after age 45 if you are at a normal weight and have a low risk for diabetes. More often and at a younger age if you are overweight or have a high risk for diabetes. What should I know about preventing infection? Hepatitis B If you have a higher risk for hepatitis B, you should be screened for this virus. Talk with your health care provider to find out if you are at risk for hepatitis B infection. Hepatitis C Blood testing is recommended for: Everyone born from 1945 through 1965. Anyone with known risk factors for hepatitis C. Sexually transmitted infections (STIs) You should be screened each year for STIs, including gonorrhea and chlamydia, if: You are sexually active and are younger than 46 years of age. You are older than 46 years of age and your   health care provider tells you that you are at risk for this type of infection. Your sexual activity has changed since you were last screened, and you are at increased risk for chlamydia or gonorrhea. Ask your health care provider if you are at risk. Ask your health care provider about whether you are at high risk for HIV. Your health care provider  may recommend a prescription medicine to help prevent HIV infection. If you choose to take medicine to prevent HIV, you should first get tested for HIV. You should then be tested every 3 months for as long as you are taking the medicine. Follow these instructions at home: Alcohol use Do not drink alcohol if your health care provider tells you not to drink. If you drink alcohol: Limit how much you have to 0-2 drinks a day. Know how much alcohol is in your drink. In the [place] one drink equals one 12 oz bottle of beer (355 mL), one 5 oz glass of wine (148 mL), or one 1 oz glass of hard liquor (44 mL). Lifestyle Do not use any products that contain nicotine or tobacco. These products include cigarettes, chewing tobacco, and vaping devices, such as e-cigarettes. If you need help quitting, ask your health care provider. Do not use street drugs. Do not share needles. Ask your health care provider for help if you need support or information about quitting drugs. General instructions Schedule regular health, dental, and eye exams. Stay current with your vaccines. Tell your health care provider if: You often feel depressed. You have ever been abused or do not feel safe at home. Summary Adopting a healthy lifestyle and getting preventive care are important in promoting health and wellness. Follow your health care provider's instructions about healthy diet, exercising, and getting tested or screened for diseases. Follow your health care provider's instructions on monitoring your cholesterol and blood pressure. This information is not intended to replace advice given to you by your health care provider. Make sure you discuss any questions you have with your health care provider. Document Revised: 07/10/2020 Document Reviewed: 07/10/2020 Elsevier Patient Education  2024 Elsevier Inc.  

## 2023-02-14 ENCOUNTER — Telehealth: Payer: Self-pay

## 2023-02-14 LAB — HEPATITIS C ANTIBODY: Hepatitis C Ab: NONREACTIVE

## 2023-02-14 MED ORDER — ZEPBOUND 2.5 MG/0.5ML ~~LOC~~ SOAJ: 2.5000 mg | SUBCUTANEOUS | 0 refills | Status: DC

## 2023-02-14 NOTE — Telephone Encounter (Signed)
Pharmacy Patient Advocate Encounter   Received notification from CoverMyMeds that prior authorization for Zepbound 2.5MG /0.5ML pen-injectors is required/requested.   Insurance verification completed.   The patient is insured through [facility] .   Per test claim: PA required; PA submitted to above mentioned insurance via CoverMyMeds Key/confirmation #/EOC [id number] Status is pending

## 2023-02-17 NOTE — Telephone Encounter (Signed)
Pharmacy Patient Advocate Encounter  Received notification from [facility] that Prior Authorization for Zepbound has been APPROVED from 02/14/2023 to 08/15/2023   PA #/Case ID/Reference #: [id number]

## 2023-03-07 LAB — COLOGUARD: COLOGUARD: NEGATIVE

## 2023-04-11 ENCOUNTER — Other Ambulatory Visit: Payer: Self-pay | Admitting: Internal Medicine

## 2023-04-11 NOTE — Telephone Encounter (Signed)
 Copied from CRM [id number]. Topic: Clinical - Medication Refill >> Apr 11, 2023  4:39 PM China J wrote: Most Recent Primary Care Visit:  Provider: JOSHUA DEBBY CROME  Department: [facility]  Visit Type: PHYSICAL  Date: 02/13/2023  Medication: tirzepatide  (ZEPBOUND ) 2.5 MG/0.5ML Pen  Has the patient contacted their pharmacy? Yes (Agent: If no, request that the patient contact the pharmacy for the refill. If patient does not wish to contact the pharmacy document the reason why and proceed with request.) (Agent: If yes, when and what did the pharmacy advise?)  Is this the correct pharmacy for this prescription? Yes If no, delete pharmacy and type the correct one.  This is the patient's preferred pharmacy:  [id number] - [place],  - [address] AT [facility] [address] [place] [address] Phone: [phone] Fax: [phone]   Has the prescription been filled recently? No  Is the patient out of the medication? Yes  Has the patient been seen for an appointment in the last year OR does the patient have an upcoming appointment? Yes  Can we respond through MyChart? Yes  Agent: Please be advised that Rx refills may take up to 3 business days. We ask that you follow-up with your pharmacy.

## 2023-04-14 ENCOUNTER — Telehealth: Payer: Self-pay | Admitting: Internal Medicine

## 2023-04-14 NOTE — Telephone Encounter (Signed)
 Please advise Pa Needed.

## 2023-04-14 NOTE — Telephone Encounter (Signed)
Copied from CRM [id number]. Topic: Clinical - Prescription Issue >> Apr 14, 2023  3:33 PM Kathryne Eriksson wrote: Reason for CRM: tirzepatide [facility]) 2.5 MG/0.5ML Pen >> Apr 14, 2023  3:35 PM Kathryne Eriksson wrote: Patient states that the pharmacy said the medication has not been authorized, therefor the insurance won't cover it. Patient is wanting to know what's going on with this and is requesting a call back. Patient call back number is [phone]

## 2023-04-15 ENCOUNTER — Other Ambulatory Visit (HOSPITAL_COMMUNITY): Payer: Self-pay

## 2023-04-15 ENCOUNTER — Other Ambulatory Visit: Payer: Self-pay | Admitting: Internal Medicine

## 2023-04-15 ENCOUNTER — Telehealth: Payer: Self-pay

## 2023-04-15 MED ORDER — ZEPBOUND 2.5 MG/0.5ML ~~LOC~~ SOAJ: 2.5000 mg | SUBCUTANEOUS | 0 refills | Status: AC

## 2023-04-15 NOTE — Telephone Encounter (Signed)
Can you give the okay or the patient to start the zepbound ?

## 2023-04-15 NOTE — Telephone Encounter (Signed)
Pharmacy Patient Advocate Encounter   Received notification from [employer] Calls Messages that prior authorization for Zepbound 2.5mg /0.45ml is required/requested.   Insurance verification completed.   The patient is insured through [facility] .   Per test claim: for the Zepbound 2.5mg /0.31ml, plan limitations exceeded, Max 4 Quantity in 365 days.   Patient has a prior authorization on file that is good until  08/15/2023. Tried a test claim for the Zepbound 5mg /0.14ml and received a paid claim.   Can the patient start on the Zepbound 5mg /0.45ml dose?  Please advise.

## 2023-04-22 ENCOUNTER — Encounter: Payer: Self-pay | Admitting: Internal Medicine

## 2023-04-22 ENCOUNTER — Ambulatory Visit (HOSPITAL_COMMUNITY)
Admission: RE | Admit: 2023-04-22 | Discharge: 2023-04-22 | Disposition: A | Discharge status: Home / Self Care | Payer: 59 | Source: Ambulatory Visit | Attending: Cardiovascular Disease | Admitting: Cardiovascular Disease

## 2023-04-22 DIAGNOSIS — R0609 Other forms of dyspnea: Secondary | ICD-10-CM | POA: Insufficient documentation

## 2023-04-22 DIAGNOSIS — R9431 Abnormal electrocardiogram [ECG] [EKG]: Secondary | ICD-10-CM | POA: Diagnosis not present

## 2023-04-22 LAB — ECHOCARDIOGRAM COMPLETE
AR max vel: 2.61 cm2
AV Area VTI: 2.38 cm2
AV Area mean vel: 2.22 cm2
AV Mean grad: 4 mm[Hg]
AV Peak grad: 6.3 mm[Hg]
Ao pk vel: 1.25 m/s
Area-P 1/2: 3.65 cm2
S' Lateral: 3.1 cm

## 2023-07-23 ENCOUNTER — Other Ambulatory Visit (HOSPITAL_COMMUNITY): Payer: Self-pay

## 2023-09-18 ENCOUNTER — Encounter (HOSPITAL_COMMUNITY): Payer: Self-pay | Admitting: *Deleted

## 2023-09-18 ENCOUNTER — Ambulatory Visit (HOSPITAL_COMMUNITY)
Admission: EM | Admit: 2023-09-18 | Discharge: 2023-09-18 | Disposition: A | Discharge status: Home / Self Care | Attending: Internal Medicine | Admitting: Internal Medicine

## 2023-09-18 DIAGNOSIS — L237 Allergic contact dermatitis due to plants, except food: Secondary | ICD-10-CM | POA: Diagnosis not present

## 2023-09-18 DIAGNOSIS — T7840XA Allergy, unspecified, initial encounter: Secondary | ICD-10-CM

## 2023-09-18 MED ORDER — METHYLPREDNISOLONE SODIUM SUCC 125 MG IJ SOLR
125.0000 mg | Freq: Once | INTRAMUSCULAR | Status: AC
  Administered 2023-09-18: 125 mg via INTRAMUSCULAR

## 2023-09-18 MED ORDER — METHYLPREDNISOLONE SODIUM SUCC 125 MG IJ SOLR
INTRAMUSCULAR | Status: AC
  Filled 2023-09-18: qty 2

## 2023-09-18 MED ORDER — TRIAMCINOLONE ACETONIDE 0.1 % EX CREA: 1.0000 | TOPICAL_CREAM | Freq: Two times a day (BID) | CUTANEOUS | 0 refills | Status: AC

## 2023-09-18 MED ORDER — METHYLPREDNISOLONE 4 MG PO TBPK: ORAL_TABLET | ORAL | 0 refills | Status: AC

## 2023-09-18 NOTE — ED Triage Notes (Signed)
 C/O poison ivy rash to bilat ears, face, neck, BUE, left side, and LLE onset after exposure 5 days ago. States feels like his skin is starting to become more swollen. Some blisters noted to various areas. Has been applying hydrocortisone and OTC poison ivy lotion. Also tried Claritin.

## 2023-09-18 NOTE — Discharge Instructions (Addendum)
 Allergic reaction to likely poison ivy or poison oak.  Given the extensive blistering, rash and swelling we will treat aggressively with steroid injection as well as steroids by mouth.  We will also prescribe steroid cream to help with itching.  We will treat with the following: Medrol  injection given today. This is a steroid to help with swelling and allergic reaction. Start 09/19/23: Medrol  dose pack. Follow package insert. Triamcinolone  cream twice daily to the affected area as needed for itching/rash.  Do not apply this to the neck or face. If you feel symptoms are not improving you can return to urgent care If you develop any swelling of the mouth, tongue, throat or difficulty breathing then go to the emergency room immediately

## 2023-09-18 NOTE — ED Provider Notes (Signed)
 [facility]    CSN: [id number] Arrival date & time: 09/18/23  1631      History   Chief Complaint No chief complaint on file.   HPI Shawn CASPERS is a 48 y.o. male.   48 year old male who presents urgent care with complaints of a worsening blisterlike rash.  He was working in his yard about 5 days ago and used his Scientist, physiological to knock down some poison ivy or poison oak.  He did not touch it that he thought.  He began developing a rash shortly after this.  It has progressed.  He has developed blistering areas especially on his left wrist and has noticed some swelling in the left hand and left wrist.  He did initially have a little bit on his face but this is gone away.  His symptoms are mostly around his arms and left flank.  He denies any mouth, throat or tongue swelling.  He denies any difficulty breathing.     Past Medical History:  Diagnosis Date   Headache     Patient Active Problem List   Diagnosis Date Noted   DOE (dyspnea on exertion) 02/13/2023   Left upper quadrant abdominal pain 02/13/2023   Need for immunization against influenza 02/13/2023   Systolic hypertension, isolated 02/13/2023   Encounter for general adult medical examination with abnormal findings 02/13/2023   Morbid obesity (HCC) 02/13/2023   Loud snoring 02/13/2023   Screening for colon cancer 02/13/2023   Need for hepatitis C screening test 02/13/2023   Abnormal electrocardiogram (ECG) (EKG) 02/13/2023   Prediabetes 02/13/2023   Hyperglycemia 08/12/2019   Class 3 severe obesity due to excess calories with serious comorbidity and body mass index (BMI) of 40.0 to 44.9 in adult 08/12/2019    Past Surgical History:  Procedure Laterality Date   knee          Home Medications    Prior to Admission medications   Medication Sig Start Date End Date Taking? Authorizing Provider  methylPREDNISolone  (MEDROL  DOSEPAK) 4 MG TBPK tablet Follow package insert 09/18/23  Yes Leanard Dimaio A,  PA-C  triamcinolone  cream (KENALOG ) 0.1 % Apply 1 Application topically 2 (two) times daily. 09/18/23  Yes Shawonda Kerce A, PA-C  tirzepatide  (ZEPBOUND ) 2.5 MG/0.5ML Pen Inject 2.5 mg into the skin once a week. 04/15/23   Joshua Debby CROME, MD    Family History Family History  Problem Relation Age of Onset   Breast cancer Mother    Diabetes Mother     Social History Social History   Tobacco Use   Smoking status: Never   Smokeless tobacco: Never  Vaping Use   Vaping status: Never Used  Substance Use Topics   Alcohol use: No   Drug use: No     Allergies   Benadryl [diphenhydramine hcl] and Penicillins   Review of Systems Review of Systems  Constitutional:  Negative for chills and fever.  HENT:  Negative for ear pain and sore throat.   Eyes:  Negative for pain and visual disturbance.  Respiratory:  Negative for cough and shortness of breath.   Cardiovascular:  Negative for chest pain and palpitations.  Gastrointestinal:  Negative for abdominal pain and vomiting.  Genitourinary:  Negative for dysuria and hematuria.  Musculoskeletal:  Negative for arthralgias and back pain.  Skin:  Positive for rash. Negative for color change.  Neurological:  Negative for seizures and syncope.  All other systems reviewed and are negative.    Physical Exam Triage Vital  Signs ED Triage Vitals  Encounter Vitals Group     BP 09/18/23 1718 131/80     Girls Systolic BP Percentile --      Girls Diastolic BP Percentile --      Boys Systolic BP Percentile --      Boys Diastolic BP Percentile --      Pulse Rate 09/18/23 1718 98     Resp 09/18/23 1718 18     Temp 09/18/23 1718 98.3 F (36.8 C)     Temp Source 09/18/23 1718 Oral     SpO2 09/18/23 1718 94 %     Weight --      Height --      Head Circumference --      Peak Flow --      Pain Score 09/18/23 1719 0     Pain Loc --      Pain Education --      Exclude from Growth Chart --    No data found.  Updated Vital Signs BP  131/80   Pulse 98   Temp 98.3 F (36.8 C) (Oral)   Resp 18   SpO2 94%   Visual Acuity Right Eye Distance:   Left Eye Distance:   Bilateral Distance:    Right Eye Near:   Left Eye Near:    Bilateral Near:     Physical Exam Vitals and nursing note reviewed.  Constitutional:      General: He is not in acute distress.    Appearance: He is well-developed.  HENT:     Head: Normocephalic and atraumatic.  Eyes:     Conjunctiva/sclera: Conjunctivae normal.  Cardiovascular:     Rate and Rhythm: Normal rate and regular rhythm.     Heart sounds: No murmur heard. Pulmonary:     Effort: Pulmonary effort is normal. No respiratory distress.     Breath sounds: Normal breath sounds.  Abdominal:     Palpations: Abdomen is soft.     Tenderness: There is no abdominal tenderness.  Musculoskeletal:        General: No swelling.     Cervical back: Neck supple.  Skin:    General: Skin is warm and dry.     Capillary Refill: Capillary refill takes less than 2 seconds.     Findings: Rash present. Rash is vesicular.     Comments: Arms especially left hand and wrist, left flank, tops of ears with vesicular rash that is erythematous 40.  Neurological:     General: No focal deficit present.     Mental Status: He is alert and oriented to person, place, and time.  Psychiatric:        Mood and Affect: Mood normal.      UC Treatments / Results  Labs (all labs ordered are listed, but only abnormal results are displayed) Labs Reviewed - No data to display  EKG   Radiology No results found.  Procedures Procedures (including critical care time)  Medications Ordered in UC Medications  methylPREDNISolone  sodium succinate (SOLU-MEDROL ) 125 mg/2 mL injection 125 mg (has no administration in time range)    Initial Impression / Assessment and Plan / UC Course  I have reviewed the triage vital signs and the nursing notes.  Pertinent labs & imaging results that were available during my care  of the patient were reviewed by me and considered in my medical decision making (see chart for details).     Poison ivy dermatitis  Allergic reaction, initial encounter  Allergic reaction to likely poison ivy or poison oak.  Given the extensive blistering, rash and swelling we will treat aggressively with steroid injection as well as steroids by mouth.  We will also prescribe steroid cream to help with itching.  We will treat with the following: Medrol  injection given today. This is a steroid to help with swelling and allergic reaction. Start 09/19/23: Medrol  dose pack. Follow package insert. Triamcinolone  cream twice daily to the affected area as needed for itching/rash.  Do not apply this to the neck or face. If you feel symptoms are not improving you can return to urgent care If you develop any swelling of the mouth, tongue, throat or difficulty breathing then go to the emergency room immediately  Final Clinical Impressions(s) / UC Diagnoses   Final diagnoses:  Poison ivy dermatitis  Allergic reaction, initial encounter     Discharge Instructions      Allergic reaction to likely poison ivy or poison oak.  Given the extensive blistering, rash and swelling we will treat aggressively with steroid injection as well as steroids by mouth.  We will also prescribe steroid cream to help with itching.  We will treat with the following: Medrol  injection given today. This is a steroid to help with swelling and allergic reaction. Start 09/19/23: Medrol  dose pack. Follow package insert. Triamcinolone  cream twice daily to the affected area as needed for itching/rash.  Do not apply this to the neck or face. If you feel symptoms are not improving you can return to urgent care If you develop any swelling of the mouth, tongue, throat or difficulty breathing then go to the emergency room immediately    ED Prescriptions     Medication Sig Dispense Auth. Provider   methylPREDNISolone  (MEDROL   DOSEPAK) 4 MG TBPK tablet Follow package insert 1 each Eziah Negro A, PA-C   triamcinolone  cream (KENALOG ) 0.1 % Apply 1 Application topically 2 (two) times daily. 454 g Teresa Almarie LABOR, [place]      PDMP not reviewed this encounter.   Teresa Almarie LABOR, PA-C 09/18/23 1752
